# Patient Record
Sex: Male | Born: 1940 | Race: White | Hispanic: No | Marital: Married | Smoking: Current every day smoker
Health system: Southern US, Community
[De-identification: ages and names within clinical notes are randomized; demographics above are authoritative.]

## PROBLEM LIST (undated history)

## (undated) DIAGNOSIS — I639 Cerebral infarction, unspecified: Secondary | ICD-10-CM

## (undated) DIAGNOSIS — R972 Elevated prostate specific antigen [PSA]: Secondary | ICD-10-CM

## (undated) DIAGNOSIS — E785 Hyperlipidemia, unspecified: Secondary | ICD-10-CM

## (undated) DIAGNOSIS — I714 Abdominal aortic aneurysm, without rupture, unspecified: Secondary | ICD-10-CM

## (undated) DIAGNOSIS — I10 Essential (primary) hypertension: Secondary | ICD-10-CM

## (undated) DIAGNOSIS — N289 Disorder of kidney and ureter, unspecified: Secondary | ICD-10-CM

## (undated) HISTORY — DX: Hyperlipidemia, unspecified: E78.5

## (undated) HISTORY — PX: VARICOSE VEIN SURGERY: SHX832

## (undated) HISTORY — PX: HEMORRHOID SURGERY: SHX153

---

## 2008-05-22 HISTORY — PX: ENDARTERECTOMY: SHX5162

## 2008-05-27 ENCOUNTER — Ambulatory Visit: Payer: Self-pay | Admitting: Vascular Surgery

## 2008-06-10 ENCOUNTER — Ambulatory Visit: Payer: Self-pay | Admitting: Vascular Surgery

## 2008-06-10 ENCOUNTER — Ambulatory Visit: Payer: Self-pay | Admitting: Cardiology

## 2008-06-14 ENCOUNTER — Inpatient Hospital Stay: Payer: Self-pay | Admitting: Vascular Surgery

## 2011-06-07 ENCOUNTER — Emergency Department: Payer: Self-pay | Admitting: *Deleted

## 2011-06-07 LAB — COMPREHENSIVE METABOLIC PANEL
Albumin: 3.5 g/dL (ref 3.4–5.0)
Alkaline Phosphatase: 119 U/L (ref 50–136)
Anion Gap: 4 — ABNORMAL LOW (ref 7–16)
Bilirubin,Total: 0.4 mg/dL (ref 0.2–1.0)
Calcium, Total: 9 mg/dL (ref 8.5–10.1)
Co2: 30 mmol/L (ref 21–32)
Creatinine: 1.61 mg/dL — ABNORMAL HIGH (ref 0.60–1.30)
EGFR (African American): 49 — ABNORMAL LOW
EGFR (Non-African Amer.): 42 — ABNORMAL LOW
Glucose: 100 mg/dL — ABNORMAL HIGH (ref 65–99)
Osmolality: 278 (ref 275–301)
Potassium: 4.2 mmol/L (ref 3.5–5.1)
SGOT(AST): 21 U/L (ref 15–37)
SGPT (ALT): 14 U/L

## 2011-06-07 LAB — CBC
HGB: 14.3 g/dL (ref 13.0–18.0)
MCH: 30.9 pg (ref 26.0–34.0)
WBC: 9.8 10*3/uL (ref 3.8–10.6)

## 2012-01-01 ENCOUNTER — Ambulatory Visit: Payer: Self-pay | Admitting: Family Medicine

## 2012-10-02 ENCOUNTER — Encounter (HOSPITAL_COMMUNITY): Payer: Self-pay | Admitting: *Deleted

## 2012-10-02 ENCOUNTER — Emergency Department (HOSPITAL_COMMUNITY): Payer: Managed Care, Other (non HMO)

## 2012-10-02 ENCOUNTER — Other Ambulatory Visit: Payer: Self-pay

## 2012-10-02 ENCOUNTER — Emergency Department (HOSPITAL_COMMUNITY)
Admission: EM | Admit: 2012-10-02 | Discharge: 2012-10-03 | Disposition: A | Discharge status: Home / Self Care | Payer: Managed Care, Other (non HMO) | Attending: Emergency Medicine | Admitting: Emergency Medicine

## 2012-10-02 DIAGNOSIS — I714 Abdominal aortic aneurysm, without rupture, unspecified: Secondary | ICD-10-CM | POA: Insufficient documentation

## 2012-10-02 DIAGNOSIS — Y92410 Unspecified street and highway as the place of occurrence of the external cause: Secondary | ICD-10-CM | POA: Insufficient documentation

## 2012-10-02 DIAGNOSIS — Y939 Activity, unspecified: Secondary | ICD-10-CM | POA: Insufficient documentation

## 2012-10-02 DIAGNOSIS — S8002XA Contusion of left knee, initial encounter: Secondary | ICD-10-CM

## 2012-10-02 DIAGNOSIS — S8000XA Contusion of unspecified knee, initial encounter: Secondary | ICD-10-CM | POA: Insufficient documentation

## 2012-10-02 DIAGNOSIS — S40019A Contusion of unspecified shoulder, initial encounter: Secondary | ICD-10-CM | POA: Insufficient documentation

## 2012-10-02 DIAGNOSIS — S40012A Contusion of left shoulder, initial encounter: Secondary | ICD-10-CM

## 2012-10-02 DIAGNOSIS — Z23 Encounter for immunization: Secondary | ICD-10-CM | POA: Insufficient documentation

## 2012-10-02 HISTORY — DX: Essential (primary) hypertension: I10

## 2012-10-02 HISTORY — DX: Cerebral infarction, unspecified: I63.9

## 2012-10-02 LAB — CBC
MCH: 32.6 pg (ref 26.0–34.0)
MCHC: 35.7 g/dL (ref 30.0–36.0)
MCV: 91.5 fL (ref 78.0–100.0)
Platelets: 170 10*3/uL (ref 150–400)
RBC: 4.84 MIL/uL (ref 4.22–5.81)
RDW: 13.1 % (ref 11.5–15.5)

## 2012-10-02 LAB — COMPREHENSIVE METABOLIC PANEL
AST: 28 U/L (ref 0–37)
CO2: 26 mEq/L (ref 19–32)
Calcium: 9.6 mg/dL (ref 8.4–10.5)
Creatinine, Ser: 2.09 mg/dL — ABNORMAL HIGH (ref 0.50–1.35)
GFR calc non Af Amer: 30 mL/min — ABNORMAL LOW (ref >90)
Sodium: 134 mEq/L — ABNORMAL LOW (ref 135–145)
Total Protein: 7.4 g/dL (ref 6.0–8.3)

## 2012-10-02 LAB — POCT I-STAT, CHEM 8
Chloride: 99 mEq/L (ref 96–112)
Creatinine, Ser: 2.4 mg/dL — ABNORMAL HIGH (ref 0.50–1.35)
HCT: 47 % (ref 39.0–52.0)
Hemoglobin: 16 g/dL (ref 13.0–17.0)
Potassium: 5.1 mEq/L (ref 3.5–5.1)
Sodium: 137 mEq/L (ref 135–145)

## 2012-10-02 MED ORDER — TETANUS-DIPHTH-ACELL PERTUSSIS 5-2.5-18.5 LF-MCG/0.5 IM SUSP
0.5000 mL | Freq: Once | INTRAMUSCULAR | Status: AC
  Administered 2012-10-02: 0.5 mL via INTRAMUSCULAR
  Filled 2012-10-02: qty 0.5

## 2012-10-02 MED ORDER — MORPHINE SULFATE 4 MG/ML IJ SOLN
4.0000 mg | Freq: Once | INTRAMUSCULAR | Status: AC
  Administered 2012-10-02: 4 mg via INTRAVENOUS
  Filled 2012-10-02: qty 1

## 2012-10-02 NOTE — ED Notes (Signed)
Attempted to ambulate pt.  When pt sat up he became very dizzy and pt was returned to supine position.  MD made aware

## 2012-10-02 NOTE — Progress Notes (Signed)
Chaplain responded to page from ED. Patient was not available and there was no family present.
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
## 2012-11-14 ENCOUNTER — Ambulatory Visit: Payer: Self-pay | Admitting: Vascular Surgery

## 2013-02-11 ENCOUNTER — Emergency Department: Payer: Self-pay | Admitting: Emergency Medicine

## 2013-02-11 LAB — COMPREHENSIVE METABOLIC PANEL
AST: 21 U/L (ref 15–37)
Albumin: 3.2 g/dL — ABNORMAL LOW (ref 3.4–5.0)
Alkaline Phosphatase: 100 U/L
Anion Gap: 3 — ABNORMAL LOW (ref 7–16)
BILIRUBIN TOTAL: 0.3 mg/dL (ref 0.2–1.0)
BUN: 51 mg/dL — ABNORMAL HIGH (ref 7–18)
CALCIUM: 9.3 mg/dL (ref 8.5–10.1)
Chloride: 103 mmol/L (ref 98–107)
Co2: 27 mmol/L (ref 21–32)
Creatinine: 2.31 mg/dL — ABNORMAL HIGH (ref 0.60–1.30)
EGFR (African American): 31 — ABNORMAL LOW
EGFR (Non-African Amer.): 27 — ABNORMAL LOW
GLUCOSE: 110 mg/dL — AB (ref 65–99)
Osmolality: 281 (ref 275–301)
Potassium: 4.5 mmol/L (ref 3.5–5.1)
SGPT (ALT): 20 U/L (ref 12–78)
SODIUM: 133 mmol/L — AB (ref 136–145)
Total Protein: 6.8 g/dL (ref 6.4–8.2)

## 2013-02-11 LAB — CBC
HCT: 43.8 % (ref 40.0–52.0)
HGB: 14.7 g/dL (ref 13.0–18.0)
MCH: 31.8 pg (ref 26.0–34.0)
MCHC: 33.6 g/dL (ref 32.0–36.0)
MCV: 95 fL (ref 80–100)
PLATELETS: 176 10*3/uL (ref 150–440)
RBC: 4.63 10*6/uL (ref 4.40–5.90)
RDW: 13.7 % (ref 11.5–14.5)
WBC: 12 10*3/uL — AB (ref 3.8–10.6)

## 2013-02-15 ENCOUNTER — Emergency Department: Payer: Self-pay | Admitting: Emergency Medicine

## 2013-02-15 LAB — COMPREHENSIVE METABOLIC PANEL
ANION GAP: 2 — AB (ref 7–16)
Albumin: 2.7 g/dL — ABNORMAL LOW (ref 3.4–5.0)
Alkaline Phosphatase: 132 U/L — ABNORMAL HIGH
BILIRUBIN TOTAL: 1.1 mg/dL — AB (ref 0.2–1.0)
BUN: 36 mg/dL — AB (ref 7–18)
CALCIUM: 9.8 mg/dL (ref 8.5–10.1)
Chloride: 97 mmol/L — ABNORMAL LOW (ref 98–107)
Co2: 30 mmol/L (ref 21–32)
Creatinine: 2.34 mg/dL — ABNORMAL HIGH (ref 0.60–1.30)
EGFR (African American): 31 — ABNORMAL LOW
EGFR (Non-African Amer.): 27 — ABNORMAL LOW
Glucose: 119 mg/dL — ABNORMAL HIGH (ref 65–99)
OSMOLALITY: 268 (ref 275–301)
Potassium: 4.4 mmol/L (ref 3.5–5.1)
SGOT(AST): 17 U/L (ref 15–37)
SGPT (ALT): 23 U/L (ref 12–78)
Sodium: 129 mmol/L — ABNORMAL LOW (ref 136–145)
Total Protein: 7.5 g/dL (ref 6.4–8.2)

## 2013-02-15 LAB — CBC
HCT: 42.5 % (ref 40.0–52.0)
HGB: 14.3 g/dL (ref 13.0–18.0)
MCH: 31.9 pg (ref 26.0–34.0)
MCHC: 33.6 g/dL (ref 32.0–36.0)
MCV: 95 fL (ref 80–100)
PLATELETS: 192 10*3/uL (ref 150–440)
RBC: 4.47 10*6/uL (ref 4.40–5.90)
RDW: 13.7 % (ref 11.5–14.5)
WBC: 13.4 10*3/uL — ABNORMAL HIGH (ref 3.8–10.6)

## 2013-02-15 LAB — URINALYSIS, COMPLETE
Bacteria: NONE SEEN
Bilirubin,UR: NEGATIVE
Glucose,UR: 50 mg/dL (ref 0–75)
Ketone: NEGATIVE
Leukocyte Esterase: NEGATIVE
Nitrite: NEGATIVE
Ph: 5 (ref 4.5–8.0)
RBC,UR: 1 /HPF (ref 0–5)
Specific Gravity: 1.019 (ref 1.003–1.030)

## 2013-02-15 LAB — CK: CK, TOTAL: 14 U/L — AB (ref 35–232)

## 2013-02-27 ENCOUNTER — Other Ambulatory Visit: Payer: Self-pay | Admitting: Nephrology

## 2013-02-27 LAB — POTASSIUM: Potassium: 5.1 mmol/L (ref 3.5–5.1)

## 2014-07-14 ENCOUNTER — Other Ambulatory Visit: Payer: Self-pay | Admitting: *Deleted

## 2014-07-14 DIAGNOSIS — I10 Essential (primary) hypertension: Secondary | ICD-10-CM

## 2014-07-15 ENCOUNTER — Other Ambulatory Visit: Payer: Self-pay

## 2014-07-15 MED ORDER — AMLODIPINE BESYLATE 10 MG PO TABS: 10.0000 mg | ORAL_TABLET | Freq: Every day | ORAL | Status: DC

## 2014-07-15 NOTE — Telephone Encounter (Signed)
Pharmacy faxed refill request.  Thanks!  

## 2014-08-02 ENCOUNTER — Telehealth: Payer: Self-pay | Admitting: Family Medicine

## 2014-08-02 NOTE — Telephone Encounter (Signed)
Requesting refill on pravastatin . Please send to [facility]

## 2014-08-13 NOTE — Telephone Encounter (Signed)
Appointment has been made for this Monday.

## 2014-08-16 ENCOUNTER — Ambulatory Visit: Payer: Self-pay | Admitting: Family Medicine

## 2014-08-16 ENCOUNTER — Ambulatory Visit (INDEPENDENT_AMBULATORY_CARE_PROVIDER_SITE_OTHER): Payer: Medicare Other | Admitting: Family Medicine

## 2014-08-16 ENCOUNTER — Encounter: Payer: Self-pay | Admitting: Family Medicine

## 2014-08-16 VITALS — BP 112/71 | HR 75 | Temp 98.3°F | Resp 18 | Ht 67.0 in | Wt 175.8 lb

## 2014-08-16 DIAGNOSIS — I10 Essential (primary) hypertension: Secondary | ICD-10-CM

## 2014-08-16 DIAGNOSIS — E785 Hyperlipidemia, unspecified: Secondary | ICD-10-CM | POA: Diagnosis not present

## 2014-08-16 DIAGNOSIS — L989 Disorder of the skin and subcutaneous tissue, unspecified: Secondary | ICD-10-CM | POA: Insufficient documentation

## 2014-08-16 DIAGNOSIS — R0989 Other specified symptoms and signs involving the circulatory and respiratory systems: Secondary | ICD-10-CM | POA: Insufficient documentation

## 2014-08-16 DIAGNOSIS — I714 Abdominal aortic aneurysm, without rupture, unspecified: Secondary | ICD-10-CM | POA: Insufficient documentation

## 2014-08-16 MED ORDER — AMLODIPINE BESYLATE 10 MG PO TABS: 10.0000 mg | ORAL_TABLET | Freq: Every day | ORAL | Status: DC

## 2014-08-16 MED ORDER — METOPROLOL TARTRATE 100 MG PO TABS: 100.0000 mg | ORAL_TABLET | Freq: Every morning | ORAL | Status: DC

## 2014-08-16 NOTE — Progress Notes (Signed)
Name: John Knight   MRN: [id number]    DOB: Mar 25, 1940   Date:08/16/2014       Progress Note  Subjective  Chief Complaint  Chief Complaint  Patient presents with  . Medication Refill  . Hyperlipidemia  . Hypertension    Hyperlipidemia This is a chronic problem. The problem is uncontrolled. Recent lipid tests were reviewed and are high. He has no history of diabetes or liver disease. Pertinent negatives include no chest pain. Current antihyperlipidemic treatment includes statins.  Hypertension This is a chronic problem. The problem is controlled. Pertinent negatives include no blurred vision, chest pain, headaches or palpitations. Past treatments include beta blockers and calcium channel blockers. The current treatment provides significant improvement. Hypertensive end-organ damage includes CVA. There is no history of angina, kidney disease or CAD/MI.      Past Medical History  Diagnosis Date  . Hypertension   . Stroke   . Hyperlipidemia     Past Surgical History  Procedure Laterality Date  . Endarterectomy Right 05/2008  . Varicose vein surgery    . Hemorrhoid surgery      Family History  Problem Relation Age of Onset  . Heart attack Mother   . Cancer Father   . Hypertension Father   . Diabetes Brother     History   Social History  . Marital Status: Married    Spouse Name: N/A  . Number of Children: N/A  . Years of Education: N/A   Occupational History  . Not on file.   Social History Main Topics  . Smoking status: Current Every Day Smoker -- 1.00 packs/day    Types: Cigarettes  . Smokeless tobacco: Not on file  . Alcohol Use: No  . Drug Use: No  . Sexual Activity: Not on file   Other Topics Concern  . Not on file   Social History Narrative     Current outpatient prescriptions:  .  amLODipine (NORVASC) 10 MG tablet, Take 1 tablet (10 mg total) by mouth daily., Disp: 30 tablet, Rfl: 0 .  aspirin 81 MG chewable tablet, Chew 81 mg by mouth daily.,  Disp: , Rfl:  .  diphenhydrAMINE (BENADRYL) 25 MG tablet, Take 25 mg by mouth at bedtime as needed for allergies., Disp: , Rfl:  .  lisinopril (PRINIVIL,ZESTRIL) 10 MG tablet, Take 10 mg by mouth daily., Disp: , Rfl:  .  metoprolol (LOPRESSOR) 100 MG tablet, Take 100 mg by mouth every morning., Disp: , Rfl: 2 .  metoprolol (LOPRESSOR) 50 MG tablet, Take 50 mg by mouth 2 (two) times daily., Disp: , Rfl:  .  pravastatin (PRAVACHOL) 40 MG tablet, Take by mouth., Disp: , Rfl:  .  rosuvastatin (CRESTOR) 5 MG tablet, Take 5 mg by mouth., Disp: , Rfl:   No Known Allergies   Review of Systems  Eyes: Negative for blurred vision.  Cardiovascular: Negative for chest pain and palpitations.  Neurological: Negative for headaches.      Objective  Filed Vitals:   08/16/14 1338  BP: 112/71  Pulse: 75  Temp: 98.3 F (36.8 C)  TempSrc: Oral  Resp: 18  Height:  (1.702 m)  Weight: 175 lb 12.8 oz (79.742 kg)  SpO2: 92%    Physical Exam  Constitutional: He is oriented to person, place, and time and well-developed, well-nourished, and in no distress.  Cardiovascular: Normal rate and regular rhythm.   Pulmonary/Chest: Effort normal and breath sounds normal.  Abdominal: Soft. Bowel sounds are normal.  Neurological:  He is alert and oriented to person, place, and time.  Skin: Lesion (pustular non-healing lesion over the left outer ear, present for over 1 year, started flaking off in the last week.) noted.  Nursing note and vitals reviewed.    Assessment & Plan 1. Hyperlipidemia Schendt is on Crestor 5 mg at bedtime. Recheck fasting lipid panel and liver enzymes and follow-up. - Comprehensive Metabolic Panel (CMET) - Lipid panel  2. Benign hypertension Blood pressure stable and controlled on present therapy. - amLODipine (NORVASC) 10 MG tablet; Take 1 tablet (10 mg total) by mouth daily.  Dispense: 90 tablet; Refill: 0 - metoprolol (LOPRESSOR) 100 MG tablet; Take 1 tablet (100 mg total)  by mouth every morning.  Dispense: 90 tablet; Refill: 0  3. Non-healing skin lesion Referral to dermatology for evaluation of nonhealing skin lesion. - Ambulatory referral to Dermatology    [facility] 08/16/2014 1:53 PM

## 2014-11-10 ENCOUNTER — Other Ambulatory Visit: Payer: Self-pay | Admitting: Family Medicine

## 2015-01-10 ENCOUNTER — Emergency Department
Admission: EM | Admit: 2015-01-10 | Discharge: 2015-01-11 | Disposition: A | Discharge status: Home / Self Care | Payer: Medicare Other | Attending: Emergency Medicine | Admitting: Emergency Medicine

## 2015-01-10 ENCOUNTER — Encounter: Payer: Self-pay | Admitting: Urgent Care

## 2015-01-10 DIAGNOSIS — I10 Essential (primary) hypertension: Secondary | ICD-10-CM | POA: Diagnosis not present

## 2015-01-10 DIAGNOSIS — Z79899 Other long term (current) drug therapy: Secondary | ICD-10-CM | POA: Diagnosis not present

## 2015-01-10 DIAGNOSIS — I714 Abdominal aortic aneurysm, without rupture, unspecified: Secondary | ICD-10-CM

## 2015-01-10 DIAGNOSIS — Z7982 Long term (current) use of aspirin: Secondary | ICD-10-CM | POA: Diagnosis not present

## 2015-01-10 DIAGNOSIS — F17210 Nicotine dependence, cigarettes, uncomplicated: Secondary | ICD-10-CM | POA: Insufficient documentation

## 2015-01-10 DIAGNOSIS — R1033 Periumbilical pain: Secondary | ICD-10-CM | POA: Diagnosis present

## 2015-01-10 HISTORY — DX: Elevated prostate specific antigen (PSA): R97.20

## 2015-01-10 HISTORY — DX: Abdominal aortic aneurysm, without rupture, unspecified: I71.40

## 2015-01-10 HISTORY — DX: Abdominal aortic aneurysm, without rupture: I71.4

## 2015-01-10 HISTORY — DX: Disorder of kidney and ureter, unspecified: N28.9

## 2015-01-10 LAB — COMPREHENSIVE METABOLIC PANEL
ALK PHOS: 126 U/L (ref 38–126)
ALT: 11 U/L — AB (ref 17–63)
AST: 16 U/L (ref 15–41)
Albumin: 4.1 g/dL (ref 3.5–5.0)
Anion gap: 9 (ref 5–15)
BILIRUBIN TOTAL: 0.7 mg/dL (ref 0.3–1.2)
BUN: 30 mg/dL — ABNORMAL HIGH (ref 6–20)
CALCIUM: 9.8 mg/dL (ref 8.9–10.3)
CO2: 29 mmol/L (ref 22–32)
CREATININE: 1.58 mg/dL — AB (ref 0.61–1.24)
Chloride: 100 mmol/L — ABNORMAL LOW (ref 101–111)
GFR calc non Af Amer: 41 mL/min — ABNORMAL LOW (ref >60)
GFR, EST AFRICAN AMERICAN: 48 mL/min — AB (ref >60)
GLUCOSE: 140 mg/dL — AB (ref 65–99)
Potassium: 3.9 mmol/L (ref 3.5–5.1)
SODIUM: 138 mmol/L (ref 135–145)
Total Protein: 7.9 g/dL (ref 6.5–8.1)

## 2015-01-10 LAB — CBC
HEMATOCRIT: 47.3 % (ref 40.0–52.0)
HEMOGLOBIN: 15.4 g/dL (ref 13.0–18.0)
MCH: 29.4 pg (ref 26.0–34.0)
MCHC: 32.6 g/dL (ref 32.0–36.0)
MCV: 90.3 fL (ref 80.0–100.0)
Platelets: 175 10*3/uL (ref 150–440)
RBC: 5.24 MIL/uL (ref 4.40–5.90)
RDW: 13.9 % (ref 11.5–14.5)
WBC: 11.6 10*3/uL — ABNORMAL HIGH (ref 3.8–10.6)

## 2015-01-10 LAB — PROTIME-INR
INR: 0.97
Prothrombin Time: 13.1 seconds (ref 11.4–15.0)

## 2015-01-10 MED ORDER — LABETALOL HCL 5 MG/ML IV SOLN
10.0000 mg | Freq: Once | INTRAVENOUS | Status: AC
  Administered 2015-01-10: 10 mg via INTRAVENOUS

## 2015-01-10 MED ORDER — LABETALOL HCL 5 MG/ML IV SOLN
INTRAVENOUS | Status: AC
  Administered 2015-01-10: 10 mg via INTRAVENOUS
  Filled 2015-01-10: qty 4

## 2015-01-10 MED ORDER — CLONIDINE HCL 0.1 MG PO TABS
0.1000 mg | ORAL_TABLET | Freq: Once | ORAL | Status: AC
  Administered 2015-01-11: 0.1 mg via ORAL
  Filled 2015-01-10: qty 1

## 2015-01-10 NOTE — ED Provider Notes (Signed)
[facility] Emergency Department Provider Note  ____________________________________________  Time seen: 11:00 PM  I have reviewed the triage vital signs and the nursing notes.   HISTORY  Chief Complaint Abdominal Pain      HPI John Knight is a 74 y.o. male presents with acute onset of periumbilical abdominal pain that was initially 8 out of 10. Patient states the pain started at 3:00 PM. Patient denies any aggravating or alleviating factors. Patient denies any nausea vomiting or diarrhea accompanying the pain. In addition patient denies any fever. Of note patient has an inoperable intra-abdominal aortic aneurysm that he was informed by Dr. Pattricia BossFarber 12/30/2014 that it had increased in size. Of note patient's blood pressure on presentation emergency department was 190/86 at bedside at present is 213/90.     Past Medical History  Diagnosis Date  . Hypertension   . Stroke (HCC)   . Hyperlipidemia   . AAA (abdominal aortic aneurysm) (HCC)   . Renal disorder   . Elevated PSA     Patient Active Problem List   Diagnosis Date Noted  . AAA (abdominal aortic aneurysm) without rupture (HCC) 08/16/2014  . Benign hypertension 08/16/2014  . Hyperlipidemia 08/16/2014  . Non-healing skin lesion 08/16/2014    Past Surgical History  Procedure Laterality Date  . Endarterectomy Right 05/2008  . Varicose vein surgery    . Hemorrhoid surgery      Current Outpatient Rx  Name  Route  Sig  Dispense  Refill  . amLODipine (NORVASC) 10 MG tablet      TAKE 1 TABLET (10 MG TOTAL) BY MOUTH DAILY.   90 tablet   0   . aspirin 81 MG chewable tablet   Oral   Chew 81 mg by mouth daily.         . diphenhydrAMINE (BENADRYL) 25 MG tablet   Oral   Take 25 mg by mouth at bedtime as needed for allergies.         . metoprolol (LOPRESSOR) 100 MG tablet      TAKE 1 TABLET (100 MG TOTAL) BY MOUTH EVERY MORNING.   90 tablet   0   . rosuvastatin (CRESTOR) 5 MG  tablet   Oral   Take 5 mg by mouth.           Allergies Review of patient's allergies indicates no known allergies.  Family History  Problem Relation Age of Onset  . Heart attack Mother   . Cancer Father   . Hypertension Father   . Diabetes Brother     Social History Social History  Substance Use Topics  . Smoking status: Current Every Day Smoker -- 1.00 packs/day    Types: Cigarettes  . Smokeless tobacco: None  . Alcohol Use: No    Review of Systems  Constitutional: Negative for fever. Eyes: Negative for visual changes. ENT: Negative for sore throat. Cardiovascular: Negative for chest pain. Respiratory: Negative for shortness of breath. Gastrointestinal: Positive for abdominal pain Genitourinary: Negative for dysuria. Musculoskeletal: Negative for back pain. Skin: Negative for rash. Neurological: Negative for headaches, focal weakness or numbness.   10-point ROS otherwise negative.  ____________________________________________   PHYSICAL EXAM:  VITAL SIGNS: ED Triage Vitals  Enc Vitals Group     BP 01/10/15 2252 190/86 mmHg     Pulse Rate 01/10/15 2252 100     Resp 01/10/15 2252 16     Temp 01/10/15 2252 98.4 F (36.9 C)     Temp Source 01/10/15 2252  Oral     SpO2 01/10/15 2252 96 %     Weight 01/10/15 2252 175 lb (79.379 kg)     Height 01/10/15 2252  (1.727 m)     Head Cir --      Peak Flow --      Pain Score 01/10/15 2252 8     Pain Loc --      Pain Edu? --      Excl. in GC? --      Constitutional: Alert and oriented. Well appearing and in no distress. Eyes: Conjunctivae are normal. PERRL. Normal extraocular movements. ENT   Head: Normocephalic and atraumatic.   Nose: No congestion/rhinnorhea.   Mouth/Throat: Mucous membranes are moist.   Neck: No stridor. Hematological/Lymphatic/Immunilogical: No cervical lymphadenopathy. Cardiovascular: Normal rate, regular rhythm. Normal and symmetric distal pulses are present in all  extremities. No murmurs, rubs, or gallops. Respiratory: Normal respiratory effort without tachypnea nor retractions. Breath sounds are clear and equal bilaterally. No wheezes/rales/rhonchi. Gastrointestinal: Soft and nontender. No distention. There is no CVA tenderness. Genitourinary: deferred Musculoskeletal: Nontender with normal range of motion in all extremities. No joint effusions.  No lower extremity tenderness nor edema. Neurologic:  Normal speech and language. No gross focal neurologic deficits are appreciated. Speech is normal.  Skin:  Skin is warm, dry and intact. No rash noted. Psychiatric: Mood and affect are normal. Speech and behavior are normal. Patient exhibits appropriate insight and judgment.  ____________________________________________    LABS (pertinent positives/negatives)  Labs Reviewed  CBC - Abnormal; Notable for the following:    WBC 11.6 (*)    All other components within normal limits  COMPREHENSIVE METABOLIC PANEL - Abnormal; Notable for the following:    Chloride 100 (*)    Glucose, Bld 140 (*)    BUN 30 (*)    Creatinine, Ser 1.58 (*)    ALT 11 (*)    GFR calc non Af Amer 41 (*)    GFR calc Af Amer 48 (*)    All other components within normal limits  PROTIME-INR     ____________________________________________   EKG ED ECG REPORT I, BROWN, [place] N, the attending physician, personally viewed and interpreted this ECG.   Date: 01/13/2015  EKG Time: 11:06 PM  Rate: 85  Rhythm: Normal sinus rhythm  Axis: None  Intervals: Normal  ST&T Change: none   RADIOLOGY CT CTA Abd/Pel w/cm &/or w/o cm (Final result) Result time: 01/11/15 01:28:51   Final result by Rad Results In Interface (01/11/15 01:28:51)   Narrative:   CLINICAL DATA: Mid abdominal pain. Known abdominal aortic aneurysm, reportedly inoperable.  EXAM: CTA ABDOMEN AND PELVIS wITHOUT AND WITH CONTRAST  TECHNIQUE: Multidetector CT imaging of the abdomen and pelvis was  performed using the standard protocol during bolus administration of intravenous contrast. Multiplanar reconstructed images and MIPs were obtained and reviewed to evaluate the vascular anatomy.  CONTRAST: 80mL OMNIPAQUE IOHEXOL 350 MG/ML SOLN  COMPARISON: 02/15/2013  FINDINGS: Lower chest and abdominal wall: Fatty enlargement of the bilateral inguinal canal.  Hepatobiliary: No focal liver abnormality.No evidence of biliary obstruction or stone.  Pancreas: Unremarkable.  Spleen: Unremarkable.  Adrenals/Urinary Tract: Negative adrenals. No hydronephrosis or stone. 1 cm mass exophytic from the lower pole left kidney a soft tissue density but is intrinsically hyperdense based on comparison noncontrast CT, likely a hemorrhagic cyst. Unremarkable bladder.  Reproductive:No pathologic findings.  Stomach/Bowel: No obstruction. No appendicitis.  Vascular/Lymphatic: There is extensive atheromatous plaque and adherent thrombus on the visualized lower  thoracic aorta. A fusiform abdominal aortic aneurysm from the renal level inferiorly has grown since prior, currently 70 x 65 mm (previously 58 x 59 mm). There is no draping of the aorta or retroperitoneal hematoma. No discontinuous atherosclerotic calcification compared to prior. High-density partly calcified material within ventral thrombus is unchanged. No evidence of superimposed dissection.  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 There is diffuse atherosclerotic luminal irregularity of the bilateral iliac arteries with moderate stenosis at the right common iliac bifurcation.  Diffuse visceral branch narrowing, high-grade at the celiac axis, proximal SMA, and proximal right renal artery.  Peritoneal: No ascites or pneumoperitoneum.  Musculoskeletal: No acute abnormalities.  Review of the MIP images confirms the above findings.  IMPRESSION: 1. 7 cm diameter abdominal aortic aneurysm which has grown 1 cm since  02/15/2013. No current sign of rupture. 2. Advanced proximal celiac and SMA stenoses. 3. No acute visceral finding.   Electronically Signed By: Marnee Spring M.D. On: 01/11/2015 01:28             INITIAL IMPRESSION / ASSESSMENT AND PLAN / ED COURSE  Pertinent labs & imaging results that were available during my care of the patient were reviewed by me and considered in my medical decision making (see chart for details).  Patient stated that he did not take his antihypertensive medications today as he forgot. Patient received labetalol 2 doses of 20 mg IV was 0.3 mg of by mouth clonidine with blood pressure improvement. Review of the patient's CAT scan from 12/8 reveals that his aneurysm is currently the same size at 7 cm. Patient had an appointment with his vascular surgeon at that time who reviewed the images. I spoke with the patient at length regarding necessity follow up with his vascular surgeon  ____________________________________________   FINAL CLINICAL IMPRESSION(S) / ED DIAGNOSES  Final diagnoses:  AAA (abdominal aortic aneurysm) without rupture (HCC)      Darci Current, MD 01/13/15 [phone]

## 2015-01-10 NOTE — ED Notes (Signed)
Patient presents with c/o mid-abdominal pain that began around 1500. Denies N/V. Last BM was tonight; stool was loose. Of note, patient has a known AAA; ballooned above and below kidney - inoperable per patient report.

## 2015-01-10 NOTE — ED Notes (Addendum)
Dr. Manson PasseyBrown at bedside.   Pt presents to ED with lower abdominal pain. Pt has a known AAA that pt states is inoperable. Pt states that AAA has grown in the last year. Pain started today after eating a BBQ sandwich. Pt denies diarrhea or fever, denies nausea.

## 2015-01-11 ENCOUNTER — Emergency Department: Payer: Medicare Other

## 2015-01-11 MED ORDER — OXYCODONE-ACETAMINOPHEN 5-325 MG PO TABS: 1.0000 | ORAL_TABLET | ORAL | Status: AC | PRN

## 2015-01-11 MED ORDER — IOHEXOL 350 MG/ML SOLN
80.0000 mL | Freq: Once | INTRAVENOUS | Status: AC | PRN
  Administered 2015-01-11: 80 mL via INTRAVENOUS

## 2015-01-11 MED ORDER — CLONIDINE HCL 0.1 MG PO TABS
0.2000 mg | ORAL_TABLET | Freq: Once | ORAL | Status: AC
  Administered 2015-01-11: 0.2 mg via ORAL
  Filled 2015-01-11: qty 2

## 2015-01-11 MED ORDER — LABETALOL HCL 5 MG/ML IV SOLN
20.0000 mg | Freq: Once | INTRAVENOUS | Status: AC
  Administered 2015-01-11: 20 mg via INTRAVENOUS
  Filled 2015-01-11: qty 4

## 2015-01-11 NOTE — Discharge Instructions (Signed)
Abdominal Aortic Aneurysm An aneurysm is a weakened or damaged part of an artery wall that bulges from the normal force of blood pumping through the body. An abdominal aortic aneurysm is an aneurysm that occurs in the lower part of the aorta, the main artery of the body.  The major concern with an abdominal aortic aneurysm is that it can enlarge and burst (rupture) or blood can flow between the layers of the wall of the aorta through a tear (aorticdissection). Both of these conditions can cause bleeding inside the body and can be life threatening unless diagnosed and treated promptly. CAUSES  The exact cause of an abdominal aortic aneurysm is unknown. Some contributing factors are:   A hardening of the arteries caused by the buildup of fat and other substances in the lining of a blood vessel (arteriosclerosis).  Inflammation of the walls of an artery (arteritis).   Connective tissue diseases, such as Marfan syndrome.   Abdominal trauma.   An infection, such as syphilis or staphylococcus, in the wall of the aorta (infectious aortitis) caused by bacteria. RISK FACTORS  Risk factors that contribute to an abdominal aortic aneurysm may include:  Age older than 60 years.   High blood pressure (hypertension).  Male gender.  Ethnicity (white race).  Obesity.  Family history of aneurysm (first degree relatives only).  Tobacco use. PREVENTION  The following healthy lifestyle habits may help decrease your risk of abdominal aortic aneurysm:  Quitting smoking. Smoking can raise your blood pressure and cause arteriosclerosis.  Limiting or avoiding alcohol.  Keeping your blood pressure, blood sugar level, and cholesterol levels within normal limits.  Decreasing your salt intake. In somepeople, too much salt can raise blood pressure and increase your risk of abdominal aortic aneurysm.  Eating a diet low in saturated fats and cholesterol.  Increasing your fiber intake by including  whole grains, vegetables, and fruits in your diet. Eating these foods may help lower blood pressure.  Maintaining a healthy weight.  Staying physically active and exercising regularly. SYMPTOMS  The symptoms of abdominal aortic aneurysm may vary depending on the size and rate of growth of the aneurysm.Most grow slowly and do not have any symptoms. When symptoms do occur, they may include:  Pain (abdomen, side, lower back, or groin). The pain may vary in intensity. A sudden onset of severe pain may indicate that the aneurysm has ruptured.  Feeling full after eating only small amounts of food.  Nausea or vomiting or both.  Feeling a pulsating lump in the abdomen.  Feeling faint or passing out. DIAGNOSIS  Since most unruptured abdominal aortic aneurysms have no symptoms, they are often discovered during diagnostic exams for other conditions. An aneurysm may be found during the following procedures:  Ultrasonography (A one-time screening for abdominal aortic aneurysm by ultrasonography is also recommended for all men aged 65-75 years who have ever smoked).  X-ray exams.  A computed tomography (CT).  Magnetic resonance imaging (MRI).  Angiography or arteriography. TREATMENT  Treatment of an abdominal aortic aneurysm depends on the size of your aneurysm, your age, and risk factors for rupture. Medication to control blood pressure and pain may be used to manage aneurysms smaller than 6 cm. Regular monitoring for enlargement may be recommended by your caregiver if:  The aneurysm is 3-4 cm in size (an annual ultrasonography may be recommended).  The aneurysm is 4-4.5 cm in size (an ultrasonography every 6 months may be recommended).  The aneurysm is larger than 4.5 cm in   size (your caregiver may ask that you be examined by a vascular surgeon). If your aneurysm is larger than 6 cm, surgical repair may be recommended. There are two main methods for repair of an aneurysm:   Endovascular  repair (a minimally invasive surgery). This is done most often.  Open repair. This method is used if an endovascular repair is not possible.   This information is not intended to replace advice given to you by your health care provider. Make sure you discuss any questions you have with your health care provider.   Document Released: 10/18/2004 Document Revised: 05/05/2012 Document Reviewed: 02/08/2012 Elsevier Interactive Patient Education 2016 Elsevier Inc.  

## 2015-01-11 NOTE — ED Notes (Signed)

## 2015-01-11 NOTE — ED Notes (Signed)
Pt denies any headache, blurred vision, numbness, or tingling at this time.

## 2015-01-11 NOTE — ED Notes (Signed)
Pt returned from CT °

## 2015-01-11 NOTE — ED Notes (Signed)
Pt transported to CT via stretcher.  

## 2015-01-11 NOTE — ED Notes (Signed)
Pt states he did not take Metroprolol and Norvasc this morning.

## 2015-01-27 ENCOUNTER — Ambulatory Visit: Payer: Medicare Other | Admitting: Family Medicine

## 2015-01-28 ENCOUNTER — Encounter: Payer: Self-pay | Admitting: Family Medicine

## 2015-01-28 ENCOUNTER — Ambulatory Visit (INDEPENDENT_AMBULATORY_CARE_PROVIDER_SITE_OTHER): Payer: Medicare Other | Admitting: Family Medicine

## 2015-01-28 VITALS — BP 142/78 | HR 68 | Temp 98.4°F | Resp 17 | Ht 68.0 in | Wt 172.8 lb

## 2015-01-28 DIAGNOSIS — E785 Hyperlipidemia, unspecified: Secondary | ICD-10-CM | POA: Diagnosis not present

## 2015-01-28 DIAGNOSIS — N183 Chronic kidney disease, stage 3 unspecified: Secondary | ICD-10-CM

## 2015-01-28 DIAGNOSIS — I714 Abdominal aortic aneurysm, without rupture, unspecified: Secondary | ICD-10-CM

## 2015-01-28 DIAGNOSIS — I10 Essential (primary) hypertension: Secondary | ICD-10-CM

## 2015-01-28 MED ORDER — AMLODIPINE BESYLATE 10 MG PO TABS: 10.0000 mg | ORAL_TABLET | Freq: Every day | ORAL | Status: DC

## 2015-01-28 MED ORDER — METOPROLOL TARTRATE 100 MG PO TABS: 100.0000 mg | ORAL_TABLET | Freq: Every day | ORAL | Status: DC

## 2015-01-28 NOTE — Progress Notes (Signed)
Name: John Knight   MRN: [id number]    DOB: [birth year]   Date:01/28/2015       Progress Note  Subjective  Chief Complaint  Chief Complaint  Patient presents with  . Follow-up    ER / abdominal pain    Hypertension This is a chronic problem. The problem is unchanged. The problem is controlled. Pertinent negatives include no blurred vision, chest pain, headaches or palpitations. Past treatments include beta blockers and calcium channel blockers. There are no compliance problems.  Hypertensive end-organ damage includes kidney disease.  Hyperlipidemia This is a chronic problem. The problem is uncontrolled. Recent lipid tests were reviewed and are high. Pertinent negatives include no chest pain. He is currently on no antihyperlipidemic treatment (Stopped taking Pravastatin approximately 3-4 months ago.). Risk factors for coronary artery disease include dyslipidemia, male sex and hypertension.   Abdominal Aortic Aneurysm Pt. Has AAA, slightly increased in diameter from 7.1cm x 6.3 cm, increased from 7.1cm x 6.0cm. Per his vascular surgeon, he will continue to monitor the aneurysm with a repeat CT in 01-22-16. [facility] ER on Monday, December 26 th with abdominal pain, a CT Scan of Abdomen revealed 7 cm AAA and advanced proximal celiac and SMA stenoses.Pt. continues to follow up with Vascular surgery at [facility]. Past Medical History  Diagnosis Date  . Hypertension   . Stroke ([place])   . Hyperlipidemia   . AAA (abdominal aortic aneurysm) ([place])   . Renal disorder   . Elevated PSA     Past Surgical History  Procedure Laterality Date  . Endarterectomy Right 05/2008  . Varicose vein surgery    . Hemorrhoid surgery      Family History  Problem Relation Age of Onset  . Heart attack Mother   . Cancer Father   . Hypertension Father   . Diabetes Brother     Social History   Social History  . Marital Status: Married    Spouse Name: N/A  . Number of Children: N/A  . Years  of Education: N/A   Occupational History  . Not on file.   Social History Main Topics  . Smoking status: Current Every Day Smoker -- 1.00 packs/day    Types: Cigarettes  . Smokeless tobacco: Not on file  . Alcohol Use: No  . Drug Use: No  . Sexual Activity: Not on file   Other Topics Concern  . Not on file   Social History Narrative     Current outpatient prescriptions:  .  amLODipine (NORVASC) 10 MG tablet, TAKE 1 TABLET (10 MG TOTAL) BY MOUTH DAILY., Disp: 90 tablet, Rfl: 0 .  aspirin 81 MG chewable tablet, Chew 81 mg by mouth daily., Disp: , Rfl:  .  aspirin-sod bicarb-citric acid (ALKA-SELTZER) 325 MG TBEF tablet, Take 325 mg by mouth every 6 (six) hours as needed., Disp: , Rfl:  .  metoprolol (LOPRESSOR) 100 MG tablet, TAKE 1 TABLET (100 MG TOTAL) BY MOUTH EVERY MORNING., Disp: 90 tablet, Rfl: 0 .  oxyCODONE-acetaminophen (ROXICET) 5-325 MG tablet, Take 1 tablet by mouth every 4 (four) hours as needed for severe pain., Disp: 20 tablet, Rfl: 0  No Known Allergies   Review of Systems  Eyes: Negative for blurred vision and double vision.  Cardiovascular: Negative for chest pain and palpitations.  Gastrointestinal: Negative for nausea, vomiting and abdominal pain.  Neurological: Negative for headaches.     Objective  Filed Vitals:   01/28/15 0949  BP: 142/78  Pulse: 68  Temp: 98.4 F (36.9 C)  TempSrc: Oral  Resp: 17  Height: _0  (1.727 m)  Weight: 172 lb 12.8 oz (78.382 kg)  SpO2: 96%    Physical Exam  Constitutional: He is oriented to person, place, and time and well-developed, well-nourished, and in no distress.  HENT:  Head: Normocephalic and atraumatic.  Cardiovascular: Normal rate and regular rhythm.   Pulmonary/Chest: Effort normal and breath sounds normal. He has no wheezes.  Abdominal: Soft. Bowel sounds are normal. There is no tenderness.  Musculoskeletal: He exhibits no edema (1+ pitting edema RLE).  Neurological: He is alert and oriented to  person, place, and time.  Skin: Skin is warm and dry.  Nursing note and vitals reviewed.   Recent Results (from the past 2160 hour(s))  CBC     Status: Abnormal   Collection Time: 01/10/15 11:20 PM  Result Value Ref Range   WBC 11.6 (H) 3.8 - 10.6 K/uL   RBC 5.24 4.40 - 5.90 MIL/uL   Hemoglobin 15.4 13.0 - 18.0 g/dL   HCT 47.3 40.0 - 52.0 %   MCV 90.3 80.0 - 100.0 fL   MCH 29.4 26.0 - 34.0 pg   MCHC 32.6 32.0 - 36.0 g/dL   RDW 13.9 11.5 - 14.5 %   Platelets 175 150 - 440 K/uL  Comprehensive metabolic panel     Status: Abnormal   Collection Time: 01/10/15 11:20 PM  Result Value Ref Range   Sodium 138 135 - 145 mmol/L   Potassium 3.9 3.5 - 5.1 mmol/L   Chloride 100 (L) 101 - 111 mmol/L   CO2 29 22 - 32 mmol/L   Glucose, Bld 140 (H) 65 - 99 mg/dL   BUN 30 (H) 6 - 20 mg/dL   Creatinine, Ser 1.58 (H) 0.61 - 1.24 mg/dL   Calcium 9.8 8.9 - 10.3 mg/dL   Total Protein 7.9 6.5 - 8.1 g/dL   Albumin 4.1 3.5 - 5.0 g/dL   AST 16 15 - 41 U/L   ALT 11 (L) 17 - 63 U/L   Alkaline Phosphatase 126 38 - 126 U/L   Total Bilirubin 0.7 0.3 - 1.2 mg/dL   GFR calc non Af Amer 41 (L) >60 mL/min   GFR calc Af Amer 48 (L) >60 mL/min    Comment: (NOTE) The eGFR has been calculated using the CKD EPI equation. This calculation has not been validated in all clinical situations. eGFR's persistently <60 mL/min signify possible Chronic Kidney Disease.    Anion gap 9 5 - 15  Protime-INR     Status: None   Collection Time: 01/10/15 11:20 PM  Result Value Ref Range   Prothrombin Time 13.1 11.4 - 15.0 seconds   INR 0.97      Assessment & Plan  1. AAA (abdominal aortic aneurysm) without rupture [facility]) Records from vascular specialist at [facility] reviewed and discussed patient patient in detail. ED note from [facility] also reviewed in detail.  2. Benign hypertension.  Blood pressure moderately elevated, although significantly improved from the time he was in the ER. He has been taking decongestants for a recent  cold which may have contributed to the increase. Advised to check his blood pressure regularly and follow-up in one month.  3. Hyperlipidemia Obtain FLP and will restart on statin therapy. - Lipid Profile  4. CKD (chronic kidney disease) stage 3, GFR 30-59 ml/min  - Comprehensive Metabolic Panel (CMET)    Jazmine Heckman [facility] 01/28/2015 10:21 AM

## 2015-02-09 ENCOUNTER — Other Ambulatory Visit: Payer: Self-pay | Admitting: Family Medicine

## 2015-02-23 ENCOUNTER — Encounter: Payer: Self-pay | Admitting: Family Medicine

## 2015-02-24 ENCOUNTER — Ambulatory Visit (INDEPENDENT_AMBULATORY_CARE_PROVIDER_SITE_OTHER): Payer: Medicare Other | Admitting: Family Medicine

## 2015-02-24 ENCOUNTER — Encounter: Payer: Self-pay | Admitting: Family Medicine

## 2015-02-24 VITALS — BP 138/70 | HR 63 | Temp 98.5°F | Resp 18 | Ht 68.0 in | Wt 171.8 lb

## 2015-02-24 DIAGNOSIS — I10 Essential (primary) hypertension: Secondary | ICD-10-CM | POA: Diagnosis not present

## 2015-02-24 MED ORDER — AMLODIPINE BESYLATE 10 MG PO TABS: 10.0000 mg | ORAL_TABLET | Freq: Every day | ORAL | Status: DC

## 2015-02-24 NOTE — Progress Notes (Signed)
Name: John Knight   MRN: [id number]    DOB: 06-07-1940   Date:02/24/2015       Progress Note  Subjective  Chief Complaint  Chief Complaint  Patient presents with  . Follow-up    1 mo   . Hyperlipidemia  . Hypertension    Hypertension This is a chronic problem. The problem is controlled. Pertinent negatives include no blurred vision, chest pain, headaches, palpitations or shortness of breath. Past treatments include beta blockers and calcium channel blockers. Hypertensive end-organ damage includes CVA (Stroke Intraoperatively while have CEA). There is no history of CAD/MI.    Past Medical History  Diagnosis Date  . Hypertension   . Stroke (HCC)   . Hyperlipidemia   . AAA (abdominal aortic aneurysm) (HCC)   . Renal disorder   . Elevated PSA     Past Surgical History  Procedure Laterality Date  . Endarterectomy Right 05/2008  . Varicose vein surgery    . Hemorrhoid surgery      Family History  Problem Relation Age of Onset  . Heart attack Mother   . Cancer Father   . Hypertension Father   . Diabetes Brother     Social History   Social History  . Marital Status: Married    Spouse Name: N/A  . Number of Children: N/A  . Years of Education: N/A   Occupational History  . Not on file.   Social History Main Topics  . Smoking status: Current Every Day Smoker -- 1.00 packs/day    Types: Cigarettes  . Smokeless tobacco: Not on file  . Alcohol Use: No  . Drug Use: No  . Sexual Activity: Not on file   Other Topics Concern  . Not on file   Social History Narrative     Current outpatient prescriptions:  .  amLODipine (NORVASC) 10 MG tablet, Take 1 tablet (10 mg total) by mouth daily., Disp: 90 tablet, Rfl: 0 .  amLODipine (NORVASC) 10 MG tablet, TAKE 1 TABLET (10 MG TOTAL) BY MOUTH DAILY., Disp: 90 tablet, Rfl: 0 .  aspirin 81 MG chewable tablet, Chew 81 mg by mouth daily., Disp: , Rfl:  .  metoprolol (LOPRESSOR) 100 MG tablet, Take 1 tablet (100 mg total) by  mouth daily., Disp: 90 tablet, Rfl: 0 .  metoprolol (LOPRESSOR) 100 MG tablet, TAKE 1 TABLET (100 MG TOTAL) BY MOUTH EVERY MORNING., Disp: 90 tablet, Rfl: 0 .  aspirin-sod bicarb-citric acid (ALKA-SELTZER) 325 MG TBEF tablet, Take 325 mg by mouth every 6 (six) hours as needed. Reported on 02/24/2015, Disp: , Rfl:  .  oxyCODONE-acetaminophen (ROXICET) 5-325 MG tablet, Take 1 tablet by mouth every 4 (four) hours as needed for severe pain. (Patient not taking: Reported on 02/24/2015), Disp: 20 tablet, Rfl: 0  No Known Allergies   Review of Systems  Eyes: Negative for blurred vision.  Respiratory: Negative for shortness of breath.   Cardiovascular: Negative for chest pain and palpitations.  Neurological: Negative for headaches.    Objective  Filed Vitals:   02/24/15 0930  BP: 138/70  Pulse: 63  Temp: 98.5 F (36.9 C)  TempSrc: Oral  Resp: 18  Height:  (1.727 m)  Weight: 171 lb 12.8 oz (77.928 kg)  SpO2: 94%    Physical Exam  Constitutional: He is oriented to person, place, and time and well-developed, well-nourished, and in no distress.  Cardiovascular: Normal rate and regular rhythm.   Pulmonary/Chest: Effort normal and breath sounds normal.  Neurological: He is  alert and oriented to person, place, and time.  Nursing note and vitals reviewed.   Assessment & Plan  1. Benign hypertension BP well controlled on therapy. Refills for amlodipine provided. - amLODipine (NORVASC) 10 MG tablet; Take 1 tablet (10 mg total) by mouth daily.  Dispense: 90 tablet; Refill: 0   Kishia Shackett [facility] 02/24/2015 10:06 AM

## 2015-05-24 ENCOUNTER — Encounter: Payer: Self-pay | Admitting: Family Medicine

## 2015-05-24 ENCOUNTER — Ambulatory Visit (INDEPENDENT_AMBULATORY_CARE_PROVIDER_SITE_OTHER): Payer: Medicare Other | Admitting: Family Medicine

## 2015-05-24 VITALS — BP 140/70 | HR 112 | Temp 97.9°F | Resp 18 | Ht 68.0 in | Wt 171.1 lb

## 2015-05-24 DIAGNOSIS — I714 Abdominal aortic aneurysm, without rupture, unspecified: Secondary | ICD-10-CM

## 2015-05-24 DIAGNOSIS — N183 Chronic kidney disease, stage 3 unspecified: Secondary | ICD-10-CM

## 2015-05-24 DIAGNOSIS — E785 Hyperlipidemia, unspecified: Secondary | ICD-10-CM | POA: Diagnosis not present

## 2015-05-24 DIAGNOSIS — I10 Essential (primary) hypertension: Secondary | ICD-10-CM | POA: Diagnosis not present

## 2015-05-24 MED ORDER — AMLODIPINE BESYLATE 10 MG PO TABS: 10.0000 mg | ORAL_TABLET | Freq: Every day | ORAL | Status: DC

## 2015-05-24 MED ORDER — METOPROLOL TARTRATE 100 MG PO TABS: 100.0000 mg | ORAL_TABLET | Freq: Every day | ORAL | Status: DC

## 2015-05-24 NOTE — Progress Notes (Signed)
Name: John Knight   MRN: [id number]    DOB: 10-29-1940   Date:05/24/2015       Progress Note  Subjective  Chief Complaint  Chief Complaint  Patient presents with  . Hypertension    pt here for 3 month follow up    Hypertension This is a chronic problem. The problem is unchanged. The problem is controlled. Pertinent negatives include no blurred vision, chest pain, headaches, palpitations or shortness of breath. Past treatments include beta blockers and calcium channel blockers.  Hyperlipidemia This is a chronic problem. The problem is uncontrolled. Pertinent negatives include no chest pain or shortness of breath. He is currently on no antihyperlipidemic treatment (Has not taken statins, Lipid panel was ordered but not obtained.).    Past Medical History  Diagnosis Date  . Hypertension   . Stroke (HCC)   . Hyperlipidemia   . AAA (abdominal aortic aneurysm) (HCC)   . Renal disorder   . Elevated PSA     Past Surgical History  Procedure Laterality Date  . Endarterectomy Right 05/2008  . Varicose vein surgery    . Hemorrhoid surgery      Family History  Problem Relation Age of Onset  . Heart attack Mother   . Cancer Father   . Hypertension Father   . Diabetes Brother     Social History   Social History  . Marital Status: Married    Spouse Name: N/A  . Number of Children: N/A  . Years of Education: N/A   Occupational History  . Not on file.   Social History Main Topics  . Smoking status: Current Every Day Smoker -- 1.00 packs/day    Types: Cigarettes  . Smokeless tobacco: Not on file  . Alcohol Use: No  . Drug Use: No  . Sexual Activity: Not on file   Other Topics Concern  . Not on file   Social History Narrative     Current outpatient prescriptions:  .  amLODipine (NORVASC) 10 MG tablet, Take 1 tablet (10 mg total) by mouth daily., Disp: 90 tablet, Rfl: 0 .  aspirin 81 MG chewable tablet, Chew 81 mg by mouth daily., Disp: , Rfl:  .  aspirin-sod  bicarb-citric acid (ALKA-SELTZER) 325 MG TBEF tablet, Take 325 mg by mouth every 6 (six) hours as needed. Reported on 02/24/2015, Disp: , Rfl:  .  metoprolol (LOPRESSOR) 100 MG tablet, Take 1 tablet (100 mg total) by mouth daily., Disp: 90 tablet, Rfl: 0 .  oxyCODONE-acetaminophen (ROXICET) 5-325 MG tablet, Take 1 tablet by mouth every 4 (four) hours as needed for severe pain. (Patient not taking: Reported on 02/24/2015), Disp: 20 tablet, Rfl: 0  No Known Allergies   Review of Systems  Constitutional: Negative for fever and chills.  Eyes: Negative for blurred vision and double vision.  Respiratory: Negative for cough and shortness of breath.   Cardiovascular: Negative for chest pain and palpitations.  Gastrointestinal: Negative for abdominal pain.  Neurological: Negative for headaches.    Objective  Filed Vitals:   05/24/15 0935  BP: 140/70  Pulse: 112  Temp: 97.9 F (36.6 C)  Resp: 18  Height: 5\' 8"  (1.727 m)  Weight: 171 lb 2 oz (77.622 kg)  SpO2: 94%    Physical Exam  Constitutional: He is oriented to person, place, and time and well-developed, well-nourished, and in no distress.  HENT:  Head: Normocephalic and atraumatic.  Cardiovascular: Normal rate and regular rhythm.   Pulmonary/Chest: Effort normal and breath sounds normal.  Abdominal: Soft. Bowel sounds are normal.  Musculoskeletal: He exhibits no edema.  Neurological: He is alert and oriented to person, place, and time.  Nursing note and vitals reviewed.    Assessment & Plan  1. Benign hypertension Blood pressure is well controlled, continue on present therapy. - amLODipine (NORVASC) 10 MG tablet; Take 1 tablet (10 mg total) by mouth daily.  Dispense: 90 tablet; Refill: 0 - metoprolol (LOPRESSOR) 100 MG tablet; Take 1 tablet (100 mg total) by mouth daily.  Dispense: 90 tablet; Refill: 0  2. Hyperlipidemia Patient has stopped statin therapy, we will obtain fasting lipid panel and consider restarting. - Lipid  Profile  3. CKD (chronic kidney disease) stage 3, GFR 30-59 ml/min  - Comprehensive Metabolic Panel (CMET)  4. AAA (abdominal aortic aneurysm) without rupture [facility]) Patient is being followed by vascular surgery at [facility] and is not deemed an operable candidate.    Eline Geng Asad A. [facility] 05/24/2015 9:45 AM

## 2015-08-27 ENCOUNTER — Other Ambulatory Visit: Payer: Self-pay | Admitting: Family Medicine

## 2015-08-27 DIAGNOSIS — I10 Essential (primary) hypertension: Secondary | ICD-10-CM

## 2015-09-13 ENCOUNTER — Other Ambulatory Visit: Payer: Self-pay | Admitting: Family Medicine

## 2015-09-13 DIAGNOSIS — I10 Essential (primary) hypertension: Secondary | ICD-10-CM

## 2015-09-15 ENCOUNTER — Other Ambulatory Visit: Payer: Self-pay

## 2015-09-15 ENCOUNTER — Encounter: Payer: Self-pay | Admitting: Family Medicine

## 2015-09-15 ENCOUNTER — Ambulatory Visit: Payer: Medicare Other

## 2015-09-15 ENCOUNTER — Ambulatory Visit
Admission: RE | Admit: 2015-09-15 | Discharge: 2015-09-15 | Disposition: A | Discharge status: Home / Self Care | Payer: Medicare Other | Source: Ambulatory Visit | Attending: Family Medicine | Admitting: Family Medicine

## 2015-09-15 ENCOUNTER — Ambulatory Visit (INDEPENDENT_AMBULATORY_CARE_PROVIDER_SITE_OTHER): Payer: Medicare Other | Admitting: Family Medicine

## 2015-09-15 ENCOUNTER — Other Ambulatory Visit: Payer: Self-pay | Admitting: Family Medicine

## 2015-09-15 VITALS — BP 118/70 | HR 105 | Temp 97.9°F | Resp 16 | Ht 68.0 in | Wt 167.2 lb

## 2015-09-15 DIAGNOSIS — N261 Atrophy of kidney (terminal): Secondary | ICD-10-CM | POA: Diagnosis not present

## 2015-09-15 DIAGNOSIS — I714 Abdominal aortic aneurysm, without rupture, unspecified: Secondary | ICD-10-CM

## 2015-09-15 DIAGNOSIS — R1033 Periumbilical pain: Secondary | ICD-10-CM

## 2015-09-15 DIAGNOSIS — N2889 Other specified disorders of kidney and ureter: Secondary | ICD-10-CM | POA: Insufficient documentation

## 2015-09-15 DIAGNOSIS — E785 Hyperlipidemia, unspecified: Secondary | ICD-10-CM | POA: Diagnosis not present

## 2015-09-15 DIAGNOSIS — I10 Essential (primary) hypertension: Secondary | ICD-10-CM

## 2015-09-15 DIAGNOSIS — I700 Atherosclerosis of aorta: Secondary | ICD-10-CM | POA: Diagnosis not present

## 2015-09-15 DIAGNOSIS — M545 Low back pain, unspecified: Secondary | ICD-10-CM | POA: Insufficient documentation

## 2015-09-15 DIAGNOSIS — R739 Hyperglycemia, unspecified: Secondary | ICD-10-CM

## 2015-09-15 LAB — COMPLETE METABOLIC PANEL WITH GFR
ALBUMIN: 3.9 g/dL (ref 3.6–5.1)
ALT: 10 U/L (ref 9–46)
AST: 14 U/L (ref 10–35)
Alkaline Phosphatase: 126 U/L — ABNORMAL HIGH (ref 40–115)
BILIRUBIN TOTAL: 0.5 mg/dL (ref 0.2–1.2)
BUN: 22 mg/dL (ref 7–25)
CHLORIDE: 97 mmol/L — AB (ref 98–110)
CO2: 29 mmol/L (ref 20–31)
CREATININE: 1.51 mg/dL — AB (ref 0.70–1.18)
Calcium: 10 mg/dL (ref 8.6–10.3)
GFR, EST NON AFRICAN AMERICAN: 44 mL/min — AB (ref >=60)
GFR, Est African American: 51 mL/min — ABNORMAL LOW (ref >=60)
Glucose, Bld: 117 mg/dL — ABNORMAL HIGH (ref 65–99)
Potassium: 4.1 mmol/L (ref 3.5–5.3)
SODIUM: 140 mmol/L (ref 135–146)
Total Protein: 7.3 g/dL (ref 6.1–8.1)

## 2015-09-15 LAB — GLUCOSE, POCT (MANUAL RESULT ENTRY): POC GLUCOSE: 101 mg/dL — AB (ref 70–99)

## 2015-09-15 LAB — CBC WITH DIFFERENTIAL/PLATELET
Basophils Absolute: 0 cells/uL (ref 0–200)
Basophils Relative: 0 %
EOS PCT: 5 %
Eosinophils Absolute: 495 cells/uL (ref 15–500)
HCT: 47.3 % (ref 38.5–50.0)
HEMOGLOBIN: 16.1 g/dL (ref 13.2–17.1)
LYMPHS ABS: 2178 {cells}/uL (ref 850–3900)
Lymphocytes Relative: 22 %
MCH: 31 pg (ref 27.0–33.0)
MCHC: 34 g/dL (ref 32.0–36.0)
MCV: 91.1 fL (ref 80.0–100.0)
MONOS PCT: 9 %
MPV: 9.5 fL (ref 7.5–12.5)
Monocytes Absolute: 891 cells/uL (ref 200–950)
NEUTROS ABS: 6336 {cells}/uL (ref 1500–7800)
NEUTROS PCT: 64 %
PLATELETS: 206 10*3/uL (ref 140–400)
RBC: 5.19 MIL/uL (ref 4.20–5.80)
RDW: 13.6 % (ref 11.0–15.0)
WBC: 9.9 10*3/uL (ref 3.8–10.8)

## 2015-09-15 LAB — LIPID PANEL
Cholesterol: 215 mg/dL — ABNORMAL HIGH (ref 125–200)
HDL: 44 mg/dL (ref >=40)
LDL CALC: 142 mg/dL — AB (ref <130)
Total CHOL/HDL Ratio: 4.9 Ratio (ref <=5.0)
Triglycerides: 144 mg/dL (ref <150)
VLDL: 29 mg/dL (ref <30)

## 2015-09-15 LAB — POCT GLYCOSYLATED HEMOGLOBIN (HGB A1C): HEMOGLOBIN A1C: 5.9

## 2015-09-15 MED ORDER — METOPROLOL TARTRATE 100 MG PO TABS: 100.0000 mg | ORAL_TABLET | Freq: Every day | ORAL | 0 refills | Status: DC

## 2015-09-15 NOTE — Progress Notes (Signed)
Name: John Knight   MRN: [id number]    DOB: [birth year]   Date:09/15/2015       Progress Note  Subjective  Chief Complaint  Chief Complaint  Patient presents with  . Follow-up    hypertension, medication refills  . Back Pain  . Abdominal Pain    Back Pain  This is a new problem. The current episode started in the past 7 days. The pain is present in the lumbar spine. The quality of the pain is described as shooting. The pain does not radiate. The pain is at a severity of 7/10. Associated symptoms include abdominal pain. Pertinent negatives include no bladder incontinence, bowel incontinence, dysuria, fever, leg pain, numbness, paresis or weakness. He has tried analgesics (Tylenol) for the symptoms. The treatment provided moderate relief.  Abdominal Pain  This is a new problem. The current episode started in the past 7 days (1+ week ago). The pain is located in the periumbilical region. The pain is at a severity of 4/10. The quality of the pain is sharp. The abdominal pain does not radiate. Pertinent negatives include no anorexia, constipation, dysuria, fever, flatus, melena, nausea or vomiting. He has tried nothing for the symptoms.     Past Medical History:  Diagnosis Date  . AAA (abdominal aortic aneurysm) (HCC)   . Elevated PSA   . Hyperlipidemia   . Hypertension   . Renal disorder   . Stroke [facility])     Past Surgical History:  Procedure Laterality Date  . ENDARTERECTOMY Right 05/2008  . HEMORRHOID SURGERY    . VARICOSE VEIN SURGERY      Family History  Problem Relation Age of Onset  . Heart attack Mother   . Cancer Father   . Hypertension Father   . Diabetes Brother     Social History   Social History  . Marital status: Married    Spouse name: N/A  . Number of children: N/A  . Years of education: N/A   Occupational History  . Not on file.   Social History Main Topics  . Smoking status: Current Every Day Smoker    Packs/day: 1.00    Types: Cigarettes  .  Smokeless tobacco: Not on file  . Alcohol use No  . Drug use: No  . Sexual activity: Not on file   Other Topics Concern  . Not on file   Social History Narrative  . No narrative on file     Current Outpatient Prescriptions:  .  amLODipine (NORVASC) 10 MG tablet, TAKE 1 TABLET (10 MG TOTAL) BY MOUTH DAILY., Disp: 90 tablet, Rfl: 0 .  aspirin 81 MG chewable tablet, Chew 81 mg by mouth daily., Disp: , Rfl:  .  metoprolol (LOPRESSOR) 100 MG tablet, TAKE 1 TABLET (100 MG TOTAL) BY MOUTH DAILY., Disp: 90 tablet, Rfl: 0 .  aspirin-sod bicarb-citric acid (ALKA-SELTZER) 325 MG TBEF tablet, Take 325 mg by mouth every 6 (six) hours as needed. Reported on 02/24/2015, Disp: , Rfl:  .  oxyCODONE-acetaminophen (ROXICET) 5-325 MG tablet, Take 1 tablet by mouth every 4 (four) hours as needed for severe pain. (Patient not taking: Reported on 02/24/2015), Disp: 20 tablet, Rfl: 0  No Known Allergies   Review of Systems  Constitutional: Negative for fever.  Gastrointestinal: Positive for abdominal pain. Negative for anorexia, bowel incontinence, constipation, flatus, melena, nausea and vomiting.  Genitourinary: Negative for bladder incontinence and dysuria.  Musculoskeletal: Positive for back pain.  Neurological: Negative for weakness and numbness.  Objective  Vitals:   09/15/15 1015  BP: 118/70  Pulse: (!) 105  Resp: 16  Temp: 97.9 F (36.6 C)  TempSrc: Oral  SpO2: 95%  Weight: 167 lb 3.2 oz (75.8 kg)  Height: 5\' 8"  (1.727 m)    Physical Exam  Constitutional: He is well-developed, well-nourished, and in no distress.  Cardiovascular: Normal rate, regular rhythm, S1 normal, S2 normal and normal heart sounds.   No murmur heard. Pulmonary/Chest: Effort normal and breath sounds normal. He has no wheezes.  Abdominal: Soft. There is tenderness in the periumbilical area. There is CVA tenderness. There is no rebound.  Musculoskeletal:       Lumbar back: He exhibits tenderness, pain and spasm.         Back:  Psychiatric: Mood, memory, affect and judgment normal.  Nursing note and vitals reviewed.    Assessment & Plan  1. Abdominal pain, acute, periumbilical Broad differential, most serious among them is the possibility of AAA rupture (patient has documented history of AAA, [facility], not a surgical candidate because of risk of permanent renal  Injury). Pain lab work and urinalysis for evaluation. - CBC with Differential - COMPLETE METABOLIC PANEL WITH GFR - CT Abdomen Pelvis Wo Contrast; Future - Urinalysis, Routine w reflex microscopic  2. Acute bilateral low back pain without sciatica Likely muscle spasm, obtain x-ray of lumbar spine - DG Lumbar Spine Complete; Future  3. Benign hypertension  - metoprolol (LOPRESSOR) 100 MG tablet; Take 1 tablet (100 mg total) by mouth daily.  Dispense: 90 tablet; Refill: 0  4. Hyperlipidemia  - Lipid Profile  5. Hyperglycemia Hemoglobin A1c is 5.9% point-of-care glucose is 101 mg/dL. This is consistent with prediabetes, - POCT HgB A1C - POCT Glucose (CBG)   Kymir Coles Asad A. Faylene KurtzShah [facility] 09/15/2015 10:27 AM

## 2015-09-16 LAB — URINALYSIS, MICROSCOPIC ONLY
BACTERIA UA: NONE SEEN [HPF]
CRYSTALS: NONE SEEN [HPF]
RBC / HPF: NONE SEEN RBC/HPF (ref <=2)
Squamous Epithelial / LPF: NONE SEEN [HPF] (ref <=5)
WBC UA: NONE SEEN WBC/HPF (ref <=5)
Yeast: NONE SEEN [HPF]

## 2015-09-16 LAB — URINALYSIS, ROUTINE W REFLEX MICROSCOPIC
Bilirubin Urine: NEGATIVE
Glucose, UA: NEGATIVE
HGB URINE DIPSTICK: NEGATIVE
KETONES UR: NEGATIVE
Leukocytes, UA: NEGATIVE
NITRITE: NEGATIVE
Specific Gravity, Urine: 1.019 (ref 1.001–1.035)
pH: 6 (ref 5.0–8.0)

## 2015-09-23 ENCOUNTER — Telehealth: Payer: Self-pay

## 2015-09-23 MED ORDER — ROSUVASTATIN CALCIUM 10 MG PO TABS: 10.0000 mg | ORAL_TABLET | Freq: Every day | ORAL | 0 refills | Status: AC

## 2015-09-23 NOTE — Telephone Encounter (Signed)
Patient has been notified of lab results and a prescription for Crestor 10 mg at bedtime has been sent to [facility] [address] per Dr. Sherryll BurgerShah, patient has been notified and verbalized understanding

## 2015-10-04 ENCOUNTER — Encounter: Payer: Self-pay | Admitting: Emergency Medicine

## 2015-10-04 ENCOUNTER — Emergency Department
Admission: EM | Admit: 2015-10-04 | Discharge: 2015-10-05 | Disposition: A | Discharge status: Home / Self Care | Payer: Medicare Other | Attending: Emergency Medicine | Admitting: Emergency Medicine

## 2015-10-04 DIAGNOSIS — I129 Hypertensive chronic kidney disease with stage 1 through stage 4 chronic kidney disease, or unspecified chronic kidney disease: Secondary | ICD-10-CM | POA: Insufficient documentation

## 2015-10-04 DIAGNOSIS — N183 Chronic kidney disease, stage 3 (moderate): Secondary | ICD-10-CM | POA: Diagnosis not present

## 2015-10-04 DIAGNOSIS — Z7982 Long term (current) use of aspirin: Secondary | ICD-10-CM | POA: Diagnosis not present

## 2015-10-04 DIAGNOSIS — R1084 Generalized abdominal pain: Secondary | ICD-10-CM | POA: Diagnosis not present

## 2015-10-04 DIAGNOSIS — Z79899 Other long term (current) drug therapy: Secondary | ICD-10-CM | POA: Diagnosis not present

## 2015-10-04 DIAGNOSIS — F17210 Nicotine dependence, cigarettes, uncomplicated: Secondary | ICD-10-CM | POA: Insufficient documentation

## 2015-10-04 LAB — CBC WITH DIFFERENTIAL/PLATELET
Basophils Absolute: 0 10*3/uL (ref 0–0.1)
Basophils Relative: 0 %
EOS ABS: 0.6 10*3/uL (ref 0–0.7)
EOS PCT: 6 %
HCT: 45.8 % (ref 40.0–52.0)
HEMOGLOBIN: 15.6 g/dL (ref 13.0–18.0)
LYMPHS ABS: 2.2 10*3/uL (ref 1.0–3.6)
LYMPHS PCT: 23 %
MCH: 31.2 pg (ref 26.0–34.0)
MCHC: 34 g/dL (ref 32.0–36.0)
MCV: 91.7 fL (ref 80.0–100.0)
MONOS PCT: 8 %
Monocytes Absolute: 0.8 10*3/uL (ref 0.2–1.0)
NEUTROS PCT: 63 %
Neutro Abs: 6 10*3/uL (ref 1.4–6.5)
Platelets: 189 10*3/uL (ref 150–440)
RBC: 5 MIL/uL (ref 4.40–5.90)
RDW: 14.1 % (ref 11.5–14.5)
WBC: 9.6 10*3/uL (ref 3.8–10.6)

## 2015-10-04 LAB — COMPREHENSIVE METABOLIC PANEL
ALK PHOS: 130 U/L — AB (ref 38–126)
ALT: 11 U/L — AB (ref 17–63)
ANION GAP: 5 (ref 5–15)
AST: 18 U/L (ref 15–41)
Albumin: 3.9 g/dL (ref 3.5–5.0)
BUN: 32 mg/dL — ABNORMAL HIGH (ref 6–20)
CALCIUM: 9.5 mg/dL (ref 8.9–10.3)
CO2: 29 mmol/L (ref 22–32)
CREATININE: 1.79 mg/dL — AB (ref 0.61–1.24)
Chloride: 104 mmol/L (ref 101–111)
GFR, EST AFRICAN AMERICAN: 41 mL/min — AB (ref >60)
GFR, EST NON AFRICAN AMERICAN: 35 mL/min — AB (ref >60)
Glucose, Bld: 150 mg/dL — ABNORMAL HIGH (ref 65–99)
Potassium: 3.8 mmol/L (ref 3.5–5.1)
SODIUM: 138 mmol/L (ref 135–145)
TOTAL PROTEIN: 7.8 g/dL (ref 6.5–8.1)
Total Bilirubin: 0.5 mg/dL (ref 0.3–1.2)

## 2015-10-04 LAB — LIPASE, BLOOD: LIPASE: 47 U/L (ref 11–51)

## 2015-10-04 MED ORDER — BARIUM SULFATE 2.1 % PO SUSP
900.0000 mL | ORAL | Status: AC
  Administered 2015-10-04 – 2015-10-05 (×2): 900 mL via ORAL

## 2015-10-04 NOTE — ED Provider Notes (Signed)
[facility] Emergency Department Provider Note   ____________________________________________   I have reviewed the triage vital signs and the nursing notes.   HISTORY  Chief Complaint Abdominal Pain (Hx of Aneurysm)   History limited by: Not Limited   HPI John Knight is a 75 y.o. male who presents to the emergency department today because of concerns for abdominal pain. Patient states he has been having pain for the past week. It is intermittent. There has been times with the pain will be completely gone. He does describe it as being located in the central abdomen. He describes it as sharp. He states it is worse with eating. He states his last bowel movement was 2 days ago. He states he normally has bowel movements every day. He thinks he might of been seen some blood in his stool. No recent fevers. Does have a history of an abdominal aortic aneurysm.   Past Medical History:  Diagnosis Date  . AAA (abdominal aortic aneurysm) (HCC)   . Elevated PSA   . Hyperlipidemia   . Hypertension   . Renal disorder   . Stroke [facility](HCC)     Patient Active Problem List   Diagnosis Date Noted  . Abdominal pain, acute, periumbilical 09/15/2015  . Acute bilateral low back pain without sciatica 09/15/2015  . Hyperglycemia 09/15/2015  . CKD (chronic kidney disease) stage 3, GFR 30-59 ml/min 01/28/2015  . AAA (abdominal aortic aneurysm) without rupture (HCC) 08/16/2014  . Benign hypertension 08/16/2014  . Hyperlipidemia 08/16/2014  . Non-healing skin lesion 08/16/2014    Past Surgical History:  Procedure Laterality Date  . ENDARTERECTOMY Right 05/2008  . HEMORRHOID SURGERY    . VARICOSE VEIN SURGERY      Prior to Admission medications   Medication Sig Start Date End Date Taking? Authorizing Provider  amLODipine (NORVASC) 10 MG tablet TAKE 1 TABLET (10 MG TOTAL) BY MOUTH DAILY. 08/29/15  Yes Ellyn HackSyed Asad A Shah, MD  aspirin 81 MG chewable tablet Chew 81 mg by mouth  daily.   Yes Historical Provider, MD  aspirin-sod bicarb-citric acid (ALKA-SELTZER) 325 MG TBEF tablet Take 325 mg by mouth every 6 (six) hours as needed. Reported on 02/24/2015   Yes Historical Provider, MD  metoprolol (LOPRESSOR) 100 MG tablet Take 1 tablet (100 mg total) by mouth daily. 09/15/15  Yes Ellyn HackSyed Asad A Shah, MD  rosuvastatin (CRESTOR) 10 MG tablet Take 1 tablet (10 mg total) by mouth at bedtime. 09/23/15  Yes Ellyn HackSyed Asad A Shah, MD  oxyCODONE-acetaminophen (ROXICET) 5-325 MG tablet Take 1 tablet by mouth every 4 (four) hours as needed for severe pain. Patient not taking: Reported on 02/24/2015 01/11/15   Darci Currentandolph N Brown, MD    Allergies Contrast media [iodinated diagnostic agents]  Family History  Problem Relation Age of Onset  . Heart attack Mother   . Cancer Father   . Hypertension Father   . Diabetes Brother     Social History Social History  Substance Use Topics  . Smoking status: Current Every Day Smoker    Packs/day: 1.00    Types: Cigarettes  . Smokeless tobacco: Not on file  . Alcohol use No    Review of Systems  Constitutional: Negative for fever. Cardiovascular: Negative for chest pain. Respiratory: Negative for shortness of breath. Gastrointestinal: Positive for abdominal pain. Genitourinary: Negative for dysuria. Musculoskeletal: Negative for back pain. Skin: Negative for rash. Neurological: Negative for headaches, focal weakness or numbness.  10-point ROS otherwise negative.  ____________________________________________  PHYSICAL EXAM:  VITAL SIGNS: ED Triage Vitals  Enc Vitals Group     BP 10/04/15 2230 (!) 208/96     Pulse Rate 10/04/15 2230 98     Resp 10/04/15 2234 20     Temp 10/04/15 2234 98.2 F (36.8 C)     Temp Source 10/04/15 2234 Oral     SpO2 10/04/15 2230 96 %     Weight 10/04/15 2236 168 lb 1.6 oz (76.2 kg)     Height 10/04/15 2236 5\' 8"  (1.727 m)     Head Circumference --      Peak Flow --      Pain Score 10/04/15 2237 4    Constitutional: Alert and oriented. Well appearing and in no distress. Eyes: Conjunctivae are normal. Normal extraocular movements. ENT   Head: Normocephalic and atraumatic.   Nose: No congestion/rhinnorhea.   Mouth/Throat: Mucous membranes are moist.   Neck: No stridor. Hematological/Lymphatic/Immunilogical: No cervical lymphadenopathy. Cardiovascular: Normal rate, regular rhythm.  No murmurs, rubs, or gallops. Respiratory: Normal respiratory effort without tachypnea nor retractions. Breath sounds are clear and equal bilaterally. No wheezes/rales/rhonchi. Gastrointestinal: Soft and nontender. Palpable pulsating mass appreciated in abdomen.  Genitourinary: Deferred Musculoskeletal: Normal range of motion in all extremities. No lower extremity edema. Neurologic:  Normal speech and language. No gross focal neurologic deficits are appreciated.  Skin:  Skin is warm, dry and intact. No rash noted. Psychiatric: Mood and affect are normal. Speech and behavior are normal. Patient exhibits appropriate insight and judgment.  ____________________________________________    LABS (pertinent positives/negatives)  Labs Reviewed  COMPREHENSIVE METABOLIC PANEL - Abnormal; Notable for the following:       Result Value   Glucose, Bld 150 (*)    BUN 32 (*)    Creatinine, Ser 1.79 (*)    ALT 11 (*)    Alkaline Phosphatase 130 (*)    GFR calc non Af Amer 35 (*)    GFR calc Af Amer 41 (*)    All other components within normal limits  CBC WITH DIFFERENTIAL/PLATELET  LIPASE, BLOOD  URINALYSIS COMPLETEWITH MICROSCOPIC ([facility] ONLY)  UA pending at time of sign out   ____________________________________________   EKG  None  ____________________________________________    RADIOLOGY  CT abd pending ____________________________________________   PROCEDURES  Procedures  ____________________________________________   INITIAL IMPRESSION / ASSESSMENT AND PLAN / ED  COURSE  Pertinent labs & imaging results that were available during my care of the patient were reviewed by me and considered in my medical decision making (see chart for details).  Patient presents to the emergency department today with concerns for abdominal pain. It has been intermittent for the past week. Patient does have a history of abdominal aneurysm. At this point I have a lower suspicion that the pain is coming from the abdominal aneurysm given intermittent nature and the fact that is worse with eating. His abdomin additionally is significantly tender. Will however plan on getting blood work, urine and ct scan. Patient has an allergy to contrast medium.  ____________________________________________   FINAL CLINICAL IMPRESSION(S) / ED DIAGNOSES  Abdominal pain.  Note: This dictation was prepared with Dragon dictation. Any transcriptional errors that result from this process are unintentional    Phineas Semen, MD 10/05/15 0009

## 2015-10-04 NOTE — ED Triage Notes (Signed)
Pt stating that he began with abdominal pain 2 weeks ago. Pt had diarrhea that was black in color. Pt stating he has not had a BM in 2 days as of now. Pt has hx of aneurysm 6.7 in size. Pt denying N/V. Dr. Derrill KayGoodman at bedside. Pt has had some LLE decreased sensitivity.

## 2015-10-05 ENCOUNTER — Emergency Department: Payer: Medicare Other

## 2015-10-05 MED ORDER — TRAMADOL HCL 50 MG PO TABS: 50.0000 mg | ORAL_TABLET | Freq: Four times a day (QID) | ORAL | 0 refills | Status: AC | PRN

## 2015-10-05 MED ORDER — PANTOPRAZOLE SODIUM 40 MG PO TBEC: 40.0000 mg | DELAYED_RELEASE_TABLET | Freq: Every day | ORAL | 1 refills | Status: AC

## 2015-10-05 MED ORDER — POLYETHYLENE GLYCOL 3350 17 G PO PACK: 17.0000 g | PACK | Freq: Every day | ORAL | 0 refills | Status: AC

## 2015-10-05 NOTE — ED Provider Notes (Signed)
IMPRESSION: 1. Unchanged size of abdominal aortic aneurysm from exam 3 weeks prior, maximal dimension 7.5 x 6.6 cm. No evidence of aortic rupture. 2. Distal esophageal thickening with enteric contrast in the distal esophagus, suggesting reflux. Presumed ingested material within the stomach. 3. No acute abnormality in the abdomen/pelvis.  I went over the CT scan results with the patient, he is currently under the care of the vascular surgeon. He may have acid reflux issues as well as some constipation. I will place him on Protonix, MiraLAX and tramadol. He is stable for outpatient follow-up.    John FilbertJonathan E Williams, MD 10/05/15 [phone]

## 2015-10-05 NOTE — ED Notes (Signed)
Pt returned from CT °

## 2015-10-20 ENCOUNTER — Other Ambulatory Visit: Payer: Self-pay | Admitting: Family Medicine

## 2015-11-07 ENCOUNTER — Ambulatory Visit (INDEPENDENT_AMBULATORY_CARE_PROVIDER_SITE_OTHER): Payer: Medicare Other | Admitting: Family Medicine

## 2015-11-07 VITALS — BP 162/82 | HR 60 | Temp 96.9°F | Ht 66.0 in | Wt 162.2 lb

## 2015-11-07 DIAGNOSIS — N183 Chronic kidney disease, stage 3 unspecified: Secondary | ICD-10-CM

## 2015-11-07 DIAGNOSIS — Z0000 Encounter for general adult medical examination without abnormal findings: Secondary | ICD-10-CM | POA: Diagnosis not present

## 2015-11-07 DIAGNOSIS — I10 Essential (primary) hypertension: Secondary | ICD-10-CM | POA: Diagnosis not present

## 2015-11-07 DIAGNOSIS — E785 Hyperlipidemia, unspecified: Secondary | ICD-10-CM | POA: Diagnosis not present

## 2015-11-07 NOTE — Progress Notes (Signed)
Name: John Knight   MRN: [id number]    DOB: 1940/03/29   Date:11/07/2015       Progress Note  Subjective  Chief Complaint  No chief complaint on file.   Hypertension  This is a chronic problem. The problem is unchanged. The problem is uncontrolled. Pertinent negatives include no blurred vision, chest pain, headaches, palpitations or shortness of breath. Past treatments include beta blockers and calcium channel blockers. Hypertensive end-organ damage includes CVA (intraoperative CVA during L. CEA.). There is no history of kidney disease or CAD/MI.  Hyperlipidemia  This is a chronic problem. The problem is uncontrolled. Recent lipid tests were reviewed and are high. Pertinent negatives include no chest pain or shortness of breath. He is currently on no antihyperlipidemic treatment.      Past Medical History:  Diagnosis Date  . AAA (abdominal aortic aneurysm) (HCC)   . Elevated PSA   . Hyperlipidemia   . Hypertension   . Renal disorder   . Stroke [facility])     Past Surgical History:  Procedure Laterality Date  . ENDARTERECTOMY Right 05/2008  . HEMORRHOID SURGERY    . VARICOSE VEIN SURGERY      Family History  Problem Relation Age of Onset  . Heart attack Mother   . Cancer Father   . Hypertension Father   . Diabetes Brother     Social History   Social History  . Marital status: Married    Spouse name: N/A  . Number of children: N/A  . Years of education: N/A   Occupational History  . Not on file.   Social History Main Topics  . Smoking status: Current Every Day Smoker    Packs/day: 1.00    Years: 60.00    Types: Cigarettes  . Smokeless tobacco: Never Used  . Alcohol use No  . Drug use: No  . Sexual activity: Not on file   Other Topics Concern  . Not on file   Social History Narrative  . No narrative on file     Current Outpatient Prescriptions:  .  acetaminophen (TYLENOL) 325 MG tablet, Take 650 mg by mouth every 6 (six) hours as needed., Disp: ,  Rfl:  .  amLODipine (NORVASC) 10 MG tablet, TAKE 1 TABLET (10 MG TOTAL) BY MOUTH DAILY., Disp: 90 tablet, Rfl: 0 .  aspirin 81 MG chewable tablet, Chew 81 mg by mouth daily., Disp: , Rfl:  .  aspirin-sod bicarb-citric acid (ALKA-SELTZER) 325 MG TBEF tablet, Take 325 mg by mouth every 6 (six) hours as needed. Reported on 02/24/2015, Disp: , Rfl:  .  metoprolol (LOPRESSOR) 100 MG tablet, Take 1 tablet (100 mg total) by mouth daily., Disp: 90 tablet, Rfl: 0 .  oxyCODONE-acetaminophen (ROXICET) 5-325 MG tablet, Take 1 tablet by mouth every 4 (four) hours as needed for severe pain. (Patient not taking: Reported on 11/07/2015), Disp: 20 tablet, Rfl: 0 .  pantoprazole (PROTONIX) 40 MG tablet, Take 1 tablet (40 mg total) by mouth daily. (Patient not taking: Reported on 11/07/2015), Disp: 30 tablet, Rfl: 1 .  polyethylene glycol (MIRALAX / GLYCOLAX) packet, Take 17 g by mouth daily. (Patient not taking: Reported on 11/07/2015), Disp: 14 each, Rfl: 0 .  rosuvastatin (CRESTOR) 10 MG tablet, Take 1 tablet (10 mg total) by mouth at bedtime. (Patient not taking: Reported on 11/07/2015), Disp: 90 tablet, Rfl: 0 .  traMADol (ULTRAM) 50 MG tablet, Take 1 tablet (50 mg total) by mouth every 6 (six) hours as needed. (Patient not  taking: Reported on 11/07/2015), Disp: 20 tablet, Rfl: 0  Allergies  Allergen Reactions  . Contrast Media [Iodinated Diagnostic Agents]      Review of Systems  Eyes: Negative for blurred vision.  Respiratory: Negative for shortness of breath.   Cardiovascular: Negative for chest pain and palpitations.  Neurological: Negative for headaches.    Objective  There were no vitals filed for this visit.  Physical Exam  Constitutional: He is oriented to person, place, and time and well-developed, well-nourished, and in no distress.  HENT:  Head: Normocephalic and atraumatic.  Cardiovascular: Normal rate, regular rhythm and normal heart sounds.   No murmur heard. Pulmonary/Chest: Effort  normal and breath sounds normal. He has no wheezes.  Musculoskeletal:  Trace pitting edema bilateral lower extremities.  Neurological: He is alert and oriented to person, place, and time.  Psychiatric: Mood, memory, affect and judgment normal.  Nursing note and vitals reviewed.    Assessment & Plan  1. Benign hypertension Elevated blood pressure could be secondary to anxiety. Recommend ambulatory blood pressure monitoring for 2 weeks, recheck and follow-up.  2. Hyperlipidemia, unspecified hyperlipidemia type Has not started taking Crestor for hyperlipidemia. Educated on importance of lipid management and advised to start Crestor, recheck  FLP in 3 months  3. CKD (chronic kidney disease) stage 3, GFR 30-59 ml/min Recommended that he should follow up with nephrology for management.   John Knight John Knight A. John KurtzShah [facility] 11/07/2015 11:48 AM

## 2015-11-07 NOTE — Patient Instructions (Signed)
Mr. John Knight , Thank you for taking time to come for your Medicare Wellness Visit. I appreciate your ongoing commitment to your health goals. Please review the following plan we discussed and let me know if I can assist you in the future.   These are the goals we discussed: Goals    . Increase water intake          Starting 11/07/15, I will start drinking 2 glasses of water a day.       This is a list of the screening recommended for you and due dates:  Health Maintenance  Topic Date Due  . Flu Shot  11/06/2016*  . Pneumonia vaccines (1 of 2 - PCV13) 11/06/2016*  . Shingles Vaccine  11/07/2026*  . Tetanus Vaccine  10/03/2022  *Topic was postponed. The date shown is not the original due date.   Preventive Care for Adults  A healthy lifestyle and preventive care can promote health and wellness. Preventive health guidelines for adults include the following key practices.  . A routine yearly physical is a good way to check with your health care provider about your health and preventive screening. It is a chance to share any concerns and updates on your health and to receive a thorough exam.  . Visit your dentist for a routine exam and preventive care every 6 months. Brush your teeth twice a day and floss once a day. Good oral hygiene prevents tooth decay and gum disease.  . The frequency of eye exams is based on your age, health, family medical history, use  of contact lenses, and other factors. Follow your health care provider's ecommendations for frequency of eye exams.  . Eat a healthy diet. Foods like vegetables, fruits, whole grains, low-fat dairy products, and lean protein foods contain the nutrients you need without too many calories. Decrease your intake of foods high in solid fats, added sugars, and salt. Eat the right amount of calories for you. Get information about a proper diet from your health care provider, if necessary.  . Regular physical exercise is one of the most  important things you can do for your health. Most adults should get at least 150 minutes of moderate-intensity exercise (any activity that increases your heart rate and causes you to sweat) each week. In addition, most adults need muscle-strengthening exercises on 2 or more days a week.  Silver Sneakers may be a benefit available to you. To determine eligibility, you may visit the website: www.silversneakers.com or contact program at [phone] Mon-Fri between 8AM-8PM.   . Maintain a healthy weight. The body mass index (BMI) is a screening tool to identify possible weight problems. It provides an estimate of body fat based on height and weight. Your health care provider can find your BMI and can help you achieve or maintain a healthy weight.   For adults 20 years and older: ? A BMI below 18.5 is considered underweight. ? A BMI of 18.5 to 24.9 is normal. ? A BMI of 25 to 29.9 is considered overweight. ? A BMI of 30 and above is considered obese.   . Maintain normal blood lipids and cholesterol levels by exercising and minimizing your intake of saturated fat. Eat a balanced diet with plenty of fruit and vegetables. Blood tests for lipids and cholesterol should begin at age 76 and be repeated every 5 years. If your lipid or cholesterol levels are high, you are over 50, or you are at high risk for heart disease, you may need  your cholesterol levels checked more frequently. Ongoing high lipid and cholesterol levels should be treated with medicines if diet and exercise are not working.  . If you smoke, find out from your health care provider how to quit. If you do not use tobacco, please do not start.  . If you choose to drink alcohol, please do not consume more than 2 drinks per day. One drink is considered to be 12 ounces (355 mL) of beer, 5 ounces (148 mL) of wine, or 1.5 ounces (44 mL) of liquor.  . If you are 46-36 years old, ask your health care provider if you should take aspirin to prevent  strokes.  . Use sunscreen. Apply sunscreen liberally and repeatedly throughout the day. You should seek shade when your shadow is shorter than you. Protect yourself by wearing long sleeves, pants, a wide-brimmed hat, and sunglasses year round, whenever you are outdoors.  . Once a month, do a whole body skin exam, using a mirror to look at the skin on your back. Tell your health care provider of new moles, moles that have irregular borders, moles that are larger than a pencil eraser, or moles that have changed in shape or color.

## 2015-11-07 NOTE — Progress Notes (Signed)
Subjective:   John Knight is a 75 y.o. male who presents for Medicare Annual/Subsequent preventive examination.  Review of Systems:  N/A Cardiac Risk Factors include: advanced age (>6055men, 68>65 women);dyslipidemia;hypertension;male gender;smoking/ tobacco exposure     Objective:    Vitals: BP (!) 162/82   Pulse 60   Temp (!) 96.9 F (36.1 C)   Ht 5\' 6"  (1.676 m)   Wt 162 lb 4 oz (73.6 kg)   BMI 26.19 kg/m   Body mass index is 26.19 kg/m.  Tobacco History  Smoking Status  . Current Every Day Smoker  . Packs/day: 1.00  . Years: 60.00  . Types: Cigarettes  Smokeless Tobacco  . Never Used     Ready to quit: No Counseling given: No   Past Medical History:  Diagnosis Date  . AAA (abdominal aortic aneurysm) (HCC)   . Elevated PSA   . Hyperlipidemia   . Hypertension   . Renal disorder   . Stroke [facility](HCC)    Past Surgical History:  Procedure Laterality Date  . ENDARTERECTOMY Right 05/2008  . HEMORRHOID SURGERY    . VARICOSE VEIN SURGERY     Family History  Problem Relation Age of Onset  . Heart attack Mother   . Cancer Father   . Hypertension Father   . Diabetes Brother    History  Sexual Activity  . Sexual activity: Not on file    Outpatient Encounter Prescriptions as of 11/07/2015  Medication Sig  . acetaminophen (TYLENOL) 325 MG tablet Take 650 mg by mouth every 6 (six) hours as needed.  Marland Kitchen. amLODipine (NORVASC) 10 MG tablet TAKE 1 TABLET (10 MG TOTAL) BY MOUTH DAILY.  Marland Kitchen. aspirin 81 MG chewable tablet Chew 81 mg by mouth daily.  Marland Kitchen. aspirin-sod bicarb-citric acid (ALKA-SELTZER) 325 MG TBEF tablet Take 325 mg by mouth every 6 (six) hours as needed. Reported on 02/24/2015  . metoprolol (LOPRESSOR) 100 MG tablet Take 1 tablet (100 mg total) by mouth daily.  Marland Kitchen. oxyCODONE-acetaminophen (ROXICET) 5-325 MG tablet Take 1 tablet by mouth every 4 (four) hours as needed for severe pain. (Patient not taking: Reported on 11/07/2015)  . pantoprazole (PROTONIX) 40 MG tablet  Take 1 tablet (40 mg total) by mouth daily. (Patient not taking: Reported on 11/07/2015)  . polyethylene glycol (MIRALAX / GLYCOLAX) packet Take 17 g by mouth daily. (Patient not taking: Reported on 11/07/2015)  . rosuvastatin (CRESTOR) 10 MG tablet Take 1 tablet (10 mg total) by mouth at bedtime. (Patient not taking: Reported on 11/07/2015)  . traMADol (ULTRAM) 50 MG tablet Take 1 tablet (50 mg total) by mouth every 6 (six) hours as needed. (Patient not taking: Reported on 11/07/2015)   No facility-administered encounter medications on file as of 11/07/2015.     Activities of Daily Living In your present state of health, do you have any difficulty performing the following activities: 11/07/2015 02/24/2015  Hearing? N N  Vision? N N  Difficulty concentrating or making decisions? Y N  Walking or climbing stairs? N N  Dressing or bathing? N N  Doing errands, shopping? N N  Preparing Food and eating ? N -  Using the Toilet? N -  In the past six months, have you accidently leaked urine? N -  Do you have problems with loss of bowel control? Y -  Managing your Medications? N -  Managing your Finances? N -  Housekeeping or managing your Housekeeping? Y -  Some recent data might be hidden  Patient Care Team: Ellyn Hack, MD as PCP - General (Family Medicine) Suszanne Finch, MD as Attending Physician (Vascular Surgery)   Assessment:     Exercise Activities and Dietary recommendations Current Exercise Habits: The patient does not participate in regular exercise at present, Exercise limited by: orthopedic condition(s)  Goals    . Increase water intake          Starting 11/07/15, I will start drinking 2 glasses of water a day.      Fall Risk Fall Risk  11/07/2015 01/28/2015 08/16/2014  Falls in the past year? No No No   Depression Screen PHQ 2/9 Scores 11/07/2015 01/28/2015 08/16/2014  PHQ - 2 Score 3 0 0  PHQ- 9 Score 8 - -    Cognitive Testing MMSE - Mini Mental State Exam  11/07/2015  Orientation to time 5  Orientation to Place 5  Registration 3  Attention/ Calculation 5  Recall 3  Language- name 2 objects 2  Language- repeat 1  Language- follow 3 step command 3  Language- read & follow direction 0  Write a sentence 0  Copy design 0  Total score 27    Immunization History  Administered Date(s) Administered  . Tdap 10/02/2012   Screening Tests Health Maintenance  Topic Date Due  . INFLUENZA VACCINE  11/06/2016 (Originally 08/23/2015)  . PNA vac Low Risk Adult (1 of 2 - PCV13) 11/06/2016 (Originally 05/07/2004)  . ZOSTAVAX  11/07/2026 (Originally 05/08/1999)  . TETANUS/TDAP  10/03/2022      Plan:    I have personally reviewed and addressed the Medicare Annual Wellness questionnaire and have noted the following in the patient's chart:  A. Medical and social history B. Use of alcohol, tobacco or illicit drugs  C. Current medications and supplements D. Functional ability and status E.  Nutritional status F.  Physical activity G. Advance directives H. List of other physicians I.  Hospitalizations, surgeries, and ER visits in previous 12 months J.  Vitals K. Screenings such as hearing and vision if needed, cognitive and depression L. Referrals and appointments - none  In addition, I have reviewed and discussed with patient certain preventive protocols, quality metrics, and best practice recommendations. A written personalized care plan for preventive services as well as general preventive health recommendations were provided to patient.  See attached scanned questionnaire for additional information.   Signed,  Hyacinth Meeker, LPN Nurse Health Advisor

## 2015-11-22 ENCOUNTER — Ambulatory Visit: Payer: Medicare Other | Admitting: Family Medicine

## 2015-11-23 ENCOUNTER — Ambulatory Visit: Payer: Medicare Other | Admitting: Family Medicine

## 2015-11-25 ENCOUNTER — Other Ambulatory Visit: Payer: Self-pay | Admitting: Family Medicine

## 2015-11-25 DIAGNOSIS — I10 Essential (primary) hypertension: Secondary | ICD-10-CM

## 2015-11-26 ENCOUNTER — Emergency Department: Payer: Medicare Other

## 2015-11-26 ENCOUNTER — Emergency Department
Admission: EM | Admit: 2015-11-26 | Discharge: 2015-11-26 | Disposition: A | Discharge status: Home / Self Care | Payer: Medicare Other | Attending: Emergency Medicine | Admitting: Emergency Medicine

## 2015-11-26 DIAGNOSIS — N183 Chronic kidney disease, stage 3 (moderate): Secondary | ICD-10-CM | POA: Insufficient documentation

## 2015-11-26 DIAGNOSIS — F17210 Nicotine dependence, cigarettes, uncomplicated: Secondary | ICD-10-CM | POA: Insufficient documentation

## 2015-11-26 DIAGNOSIS — R109 Unspecified abdominal pain: Secondary | ICD-10-CM | POA: Diagnosis present

## 2015-11-26 DIAGNOSIS — I129 Hypertensive chronic kidney disease with stage 1 through stage 4 chronic kidney disease, or unspecified chronic kidney disease: Secondary | ICD-10-CM | POA: Insufficient documentation

## 2015-11-26 DIAGNOSIS — Z79899 Other long term (current) drug therapy: Secondary | ICD-10-CM | POA: Insufficient documentation

## 2015-11-26 DIAGNOSIS — Z7982 Long term (current) use of aspirin: Secondary | ICD-10-CM | POA: Diagnosis not present

## 2015-11-26 LAB — CBC WITH DIFFERENTIAL/PLATELET
Basophils Absolute: 0 10*3/uL (ref 0–0.1)
Basophils Relative: 0 %
EOS ABS: 0.4 10*3/uL (ref 0–0.7)
Eosinophils Relative: 4 %
HEMATOCRIT: 44.6 % (ref 40.0–52.0)
HEMOGLOBIN: 15.4 g/dL (ref 13.0–18.0)
LYMPHS ABS: 2 10*3/uL (ref 1.0–3.6)
Lymphocytes Relative: 19 %
MCH: 31.5 pg (ref 26.0–34.0)
MCHC: 34.6 g/dL (ref 32.0–36.0)
MCV: 90.9 fL (ref 80.0–100.0)
Monocytes Absolute: 0.9 10*3/uL (ref 0.2–1.0)
Monocytes Relative: 8 %
NEUTROS ABS: 7 10*3/uL — AB (ref 1.4–6.5)
NEUTROS PCT: 69 %
Platelets: 170 10*3/uL (ref 150–440)
RBC: 4.91 MIL/uL (ref 4.40–5.90)
RDW: 13.8 % (ref 11.5–14.5)
WBC: 10.3 10*3/uL (ref 3.8–10.6)

## 2015-11-26 LAB — BASIC METABOLIC PANEL
Anion gap: 8 (ref 5–15)
BUN: 29 mg/dL — AB (ref 6–20)
CHLORIDE: 101 mmol/L (ref 101–111)
CO2: 28 mmol/L (ref 22–32)
Calcium: 9.6 mg/dL (ref 8.9–10.3)
Creatinine, Ser: 1.56 mg/dL — ABNORMAL HIGH (ref 0.61–1.24)
GFR calc non Af Amer: 41 mL/min — ABNORMAL LOW (ref >60)
GFR, EST AFRICAN AMERICAN: 48 mL/min — AB (ref >60)
Glucose, Bld: 119 mg/dL — ABNORMAL HIGH (ref 65–99)
POTASSIUM: 3.7 mmol/L (ref 3.5–5.1)
SODIUM: 137 mmol/L (ref 135–145)

## 2015-11-26 MED ORDER — DICYCLOMINE HCL 20 MG PO TABS: 20.0000 mg | ORAL_TABLET | Freq: Three times a day (TID) | ORAL | 0 refills | Status: AC | PRN

## 2015-11-26 NOTE — Discharge Instructions (Signed)
Please seek medical attention for any high fevers, chest pain, shortness of breath, change in behavior, persistent vomiting, bloody stool or any other new or concerning symptoms.  

## 2015-11-26 NOTE — ED Provider Notes (Signed)
[facility] Emergency Department Provider Note   ____________________________________________   I have reviewed the triage vital signs and the nursing notes.   HISTORY  Chief Complaint Abdominal Pain   History limited by: Not Limited   HPI John Knight is a 75 y.o. male who presents to the emergency department today with continued abdominal pain. The pain is located centrally, with the central part of the pain being located just below his belly button. The patient has had the pain for a number of months and I personally evaluated him in the emergency department two months ago for this pain. He has had some nausea with the pain recently but no vomiting. He has not noticed any change in his stool recently. Denies any fevers. Has not followed up with a GI doctor.Patient does have a history of a AAA and is followed at [facility].   Past Medical History:  Diagnosis Date  . AAA (abdominal aortic aneurysm) (HCC)   . Elevated PSA   . Hyperlipidemia   . Hypertension   . Renal disorder   . Stroke [facility])     Patient Active Problem List   Diagnosis Date Noted  . Abdominal pain, acute, periumbilical 09/15/2015  . Acute bilateral low back pain without sciatica 09/15/2015  . Hyperglycemia 09/15/2015  . CKD (chronic kidney disease) stage 3, GFR 30-59 ml/min 01/28/2015  . AAA (abdominal aortic aneurysm) without rupture (HCC) 08/16/2014  . Benign hypertension 08/16/2014  . Hyperlipidemia 08/16/2014  . Non-healing skin lesion 08/16/2014    Past Surgical History:  Procedure Laterality Date  . ENDARTERECTOMY Right 05/2008  . HEMORRHOID SURGERY    . VARICOSE VEIN SURGERY      Prior to Admission medications   Medication Sig Start Date End Date Taking? Authorizing Provider  acetaminophen (TYLENOL) 325 MG tablet Take 650 mg by mouth every 6 (six) hours as needed.    Historical Provider, MD  amLODipine (NORVASC) 10 MG tablet TAKE 1 TABLET (10 MG TOTAL) BY MOUTH DAILY.  11/25/15   Ellyn Hack, MD  aspirin 81 MG chewable tablet Chew 81 mg by mouth daily.    Historical Provider, MD  aspirin-sod bicarb-citric acid (ALKA-SELTZER) 325 MG TBEF tablet Take 325 mg by mouth every 6 (six) hours as needed. Reported on 02/24/2015    Historical Provider, MD  metoprolol (LOPRESSOR) 100 MG tablet Take 1 tablet (100 mg total) by mouth daily. 09/15/15   Ellyn Hack, MD  oxyCODONE-acetaminophen (ROXICET) 5-325 MG tablet Take 1 tablet by mouth every 4 (four) hours as needed for severe pain. Patient not taking: Reported on 11/07/2015 01/11/15   Darci Current, MD  pantoprazole (PROTONIX) 40 MG tablet Take 1 tablet (40 mg total) by mouth daily. Patient not taking: Reported on 11/07/2015 10/05/15 10/04/16  Emily Filbert, MD  polyethylene glycol [facility]) packet Take 17 g by mouth daily. Patient not taking: Reported on 11/07/2015 10/05/15   Emily Filbert, MD  rosuvastatin (CRESTOR) 10 MG tablet Take 1 tablet (10 mg total) by mouth at bedtime. Patient not taking: Reported on 11/07/2015 09/23/15   Ellyn Hack, MD  traMADol (ULTRAM) 50 MG tablet Take 1 tablet (50 mg total) by mouth every 6 (six) hours as needed. Patient not taking: Reported on 11/07/2015 10/05/15 10/04/16  Emily Filbert, MD    Allergies Contrast media [iodinated diagnostic agents]  Family History  Problem Relation Age of Onset  . Heart attack Mother   . Cancer  Father   . Hypertension Father   . Diabetes Brother     Social History Social History  Substance Use Topics  . Smoking status: Current Every Day Smoker    Packs/day: 1.00    Years: 60.00    Types: Cigarettes  . Smokeless tobacco: Never Used  . Alcohol use No    Review of Systems  Constitutional: Negative for fever. Cardiovascular: Negative for chest pain. Respiratory: Negative for shortness of breath. Gastrointestinal: Positive for abdominal pain.  Genitourinary: Negative for dysuria. Musculoskeletal:  Negative for back pain. Skin: Negative for rash. Neurological: Negative for headaches, focal weakness or numbness.  10-point ROS otherwise negative.  ____________________________________________   PHYSICAL EXAM:  VITAL SIGNS: ED Triage Vitals [11/26/15 0858]  Enc Vitals Group     BP 162/82     Pulse 60     Resp 15     Temp 96.9     Temp src      SpO2 95     Weight 162 lb (73.5 kg)     Height 5\' 8"  (1.727 m)     Head Circumference      Peak Flow      Pain Score 6   Constitutional: Alert and oriented. Well appearing and in no distress. Eyes: Conjunctivae are normal. Normal extraocular movements. ENT   Head: Normocephalic and atraumatic.   Nose: No congestion/rhinnorhea.   Mouth/Throat: Mucous membranes are moist.   Neck: No stridor. Hematological/Lymphatic/Immunilogical: No cervical lymphadenopathy. Cardiovascular: Normal rate, regular rhythm.  No murmurs, rubs, or gallops.  Respiratory: Normal respiratory effort without tachypnea nor retractions. Breath sounds are clear and equal bilaterally. No wheezes/rales/rhonchi. Gastrointestinal: Soft and nontender. No distention.  Genitourinary: Deferred Musculoskeletal: Normal range of motion in all extremities. No lower extremity edema. Neurologic:  Normal speech and language. No gross focal neurologic deficits are appreciated.  Skin:  Skin is warm, dry and intact. No rash noted. Psychiatric: Mood and affect are normal. Speech and behavior are normal. Patient exhibits appropriate insight and judgment.  ____________________________________________    LABS (pertinent positives/negatives)  Labs Reviewed  CBC WITH DIFFERENTIAL/PLATELET - Abnormal; Notable for the following:       Result Value   Neutro Abs 7.0 (*)    All other components within normal limits  BASIC METABOLIC PANEL - Abnormal; Notable for the following:    Glucose, Bld 119 (*)    BUN 29 (*)    Creatinine, Ser 1.56 (*)    GFR calc non Af Amer 41  (*)    GFR calc Af Amer 48 (*)    All other components within normal limits     ____________________________________________   EKG  I, Phineas SemenGraydon Angellica Maddison, attending physician, personally viewed and interpreted this EKG  EKG Time: 0902 Rate: 61 Rhythm: normal sinus rhythm with 1st degree av block Axis: left axis deviation Intervals: qtc 444 QRS: narrow, q waves V1-V4 ST changes: no st elevation Impression: abnormal ekg   ____________________________________________    RADIOLOGY  CT abd w/o contrast IMPRESSION:  Stable abdominal aortic aneurysm.    No acute abnormality is noted.     ____________________________________________   PROCEDURES  Procedures  ____________________________________________   INITIAL IMPRESSION / ASSESSMENT AND PLAN / ED COURSE  Pertinent labs & imaging results that were available during my care of the patient were reviewed by me and considered in my medical decision making (see chart for details).  Patient here with continued and somewhat chronic abdominal pain. With history of AAA ct scan was done  which did not show any significant increase in size. At this point given that patient is not in extremis do feel it is extremely unlikely that the AAA is the cause of the patient's symptoms. Will discharge to follow up with PCP. Patient has appointment scheduled.  ____________________________________________   FINAL CLINICAL IMPRESSION(S) / ED DIAGNOSES  Final diagnoses:  Abdominal pain, unspecified abdominal location     Note: This dictation was prepared with Dragon dictation. Any transcriptional errors that result from this process are unintentional    Phineas SemenGraydon Michelene Keniston, MD 11/26/15 1238

## 2015-11-26 NOTE — ED Triage Notes (Signed)
Pt states that he has been having lower abd pain for the past few days, states that it has been intermittent, pt has some nausea and diarrhea as well, pt has a hx of aortic aneuysm

## 2015-12-13 ENCOUNTER — Ambulatory Visit (INDEPENDENT_AMBULATORY_CARE_PROVIDER_SITE_OTHER): Payer: Medicare Other | Admitting: Family Medicine

## 2015-12-13 ENCOUNTER — Encounter: Payer: Self-pay | Admitting: Family Medicine

## 2015-12-13 VITALS — BP 162/78 | HR 63 | Temp 98.0°F | Resp 16 | Ht 68.0 in | Wt 160.8 lb

## 2015-12-13 DIAGNOSIS — R1032 Left lower quadrant pain: Secondary | ICD-10-CM

## 2015-12-13 DIAGNOSIS — E785 Hyperlipidemia, unspecified: Secondary | ICD-10-CM

## 2015-12-13 DIAGNOSIS — I10 Essential (primary) hypertension: Secondary | ICD-10-CM

## 2015-12-13 MED ORDER — AMLODIPINE BESYLATE 10 MG PO TABS: 10.0000 mg | ORAL_TABLET | Freq: Every day | ORAL | 0 refills | Status: AC

## 2015-12-13 MED ORDER — LOSARTAN POTASSIUM 50 MG PO TABS: 50.0000 mg | ORAL_TABLET | Freq: Every day | ORAL | 0 refills | Status: AC

## 2015-12-13 MED ORDER — METOPROLOL TARTRATE 100 MG PO TABS: 100.0000 mg | ORAL_TABLET | Freq: Every day | ORAL | 0 refills | Status: AC

## 2015-12-13 NOTE — Progress Notes (Signed)
Name: John DankerLarry W Keisling   MRN: [id number]    DOB: 1940/11/05   Date:12/13/2015       Progress Note  Subjective  Chief Complaint  Chief Complaint  Patient presents with  . GI Problem  . Referral    GI    GI Problem  The primary symptoms include abdominal pain. Primary symptoms do not include fever, weight loss, nausea, vomiting, diarrhea or melena.  The illness does not include chills or constipation.  Hypertension  This is a chronic problem. The problem is unchanged. The problem is uncontrolled. Associated symptoms include malaise/fatigue and shortness of breath. Pertinent negatives include no blurred vision, chest pain, headaches or palpitations. Past treatments include calcium channel blockers and beta blockers. The current treatment provides moderate improvement. Hypertensive end-organ damage includes kidney disease. There is no history of CAD/MI, CVA or heart failure.    Pt. is here to request referral to Gastroenterology for persistent complaints of periumbilical pain, sharp, present for over a month, comes and goes with pain-free days in between. He has [facility] and CT scan of abdomen and pelvis revealed no acute abnormalities except for a stable known aneurysm. He has been prescribed Bentyl prescribed by Dr. Derrill KayGoodman from [facility], which helps relieve the pain.    Past Medical History:  Diagnosis Date  . AAA (abdominal aortic aneurysm) (HCC)   . Elevated PSA   . Hyperlipidemia   . Hypertension   . Renal disorder   . Stroke [facility](HCC)     Past Surgical History:  Procedure Laterality Date  . ENDARTERECTOMY Right 05/2008  . HEMORRHOID SURGERY    . VARICOSE VEIN SURGERY      Family History  Problem Relation Age of Onset  . Heart attack Mother   . Cancer Father   . Hypertension Father   . Diabetes Brother     Social History   Social History  . Marital status: Married    Spouse name: N/A  . Number of children: N/A  . Years of education: N/A    Occupational History  . Not on file.   Social History Main Topics  . Smoking status: Current Every Day Smoker    Packs/day: 1.00    Years: 60.00    Types: Cigarettes  . Smokeless tobacco: Never Used  . Alcohol use No  . Drug use: No  . Sexual activity: Not on file   Other Topics Concern  . Not on file   Social History Narrative  . No narrative on file     Current Outpatient Prescriptions:  .  acetaminophen (TYLENOL) 325 MG tablet, Take 650 mg by mouth every 6 (six) hours as needed., Disp: , Rfl:  .  amLODipine (NORVASC) 10 MG tablet, TAKE 1 TABLET (10 MG TOTAL) BY MOUTH DAILY., Disp: 90 tablet, Rfl: 0 .  aspirin 81 MG chewable tablet, Chew 81 mg by mouth daily., Disp: , Rfl:  .  aspirin-sod bicarb-citric acid (ALKA-SELTZER) 325 MG TBEF tablet, Take 325 mg by mouth every 6 (six) hours as needed. Reported on 02/24/2015, Disp: , Rfl:  .  dicyclomine (BENTYL) 20 MG tablet, Take 1 tablet (20 mg total) by mouth 3 (three) times daily as needed for spasms., Disp: 30 tablet, Rfl: 0 .  metoprolol (LOPRESSOR) 100 MG tablet, Take 1 tablet (100 mg total) by mouth daily., Disp: 90 tablet, Rfl: 0 .  oxyCODONE-acetaminophen (ROXICET) 5-325 MG tablet, Take 1 tablet by mouth every 4 (four) hours as needed for severe  pain., Disp: 20 tablet, Rfl: 0 .  pantoprazole (PROTONIX) 40 MG tablet, Take 1 tablet (40 mg total) by mouth daily., Disp: 30 tablet, Rfl: 1 .  polyethylene glycol (MIRALAX / GLYCOLAX) packet, Take 17 g by mouth daily., Disp: 14 each, Rfl: 0 .  rosuvastatin (CRESTOR) 10 MG tablet, Take 1 tablet (10 mg total) by mouth at bedtime., Disp: 90 tablet, Rfl: 0 .  traMADol (ULTRAM) 50 MG tablet, Take 1 tablet (50 mg total) by mouth every 6 (six) hours as needed., Disp: 20 tablet, Rfl: 0  Allergies  Allergen Reactions  . Contrast Media [Iodinated Diagnostic Agents]      Review of Systems  Constitutional: Positive for malaise/fatigue. Negative for chills, fever and weight loss.  Eyes:  Negative for blurred vision.  Respiratory: Positive for shortness of breath.   Cardiovascular: Negative for chest pain and palpitations.  Gastrointestinal: Positive for abdominal pain. Negative for blood in stool, constipation, diarrhea, melena, nausea and vomiting.  Neurological: Negative for headaches.     Objective  Vitals:   12/13/15 1330  BP: (!) 162/78  Pulse: 63  Resp: 16  Temp: 98 F (36.7 C)  TempSrc: Oral  SpO2: 95%  Weight: 160 lb 12.8 oz (72.9 kg)  Height: 5\' 8"  (1.727 m)    Physical Exam  Constitutional: He is oriented to person, place, and time and well-developed, well-nourished, and in no distress.  HENT:  Head: Normocephalic and atraumatic.  Cardiovascular: Normal rate, regular rhythm and normal heart sounds.   No murmur heard. Pulmonary/Chest: Effort normal and breath sounds normal. He has no wheezes.  Abdominal: Soft. Bowel sounds are normal. There is no tenderness.  Musculoskeletal: He exhibits no edema.  Neurological: He is alert and oriented to person, place, and time.  Psychiatric: Mood, memory, affect and judgment normal.  Nursing note and vitals reviewed.     Assessment & Plan  1. Benign hypertension BP is elevated uncontrolled, will add losartan 50 mg to his regimen, advised to check BP at night and review in 6-8 weeks. - metoprolol (LOPRESSOR) 100 MG tablet; Take 1 tablet (100 mg total) by mouth daily.  Dispense: 90 tablet; Refill: 0 - amLODipine (NORVASC) 10 MG tablet; Take 1 tablet (10 mg total) by mouth daily.  Dispense: 90 tablet; Refill: 0 - losartan (COZAAR) 50 MG tablet; Take 1 tablet (50 mg total) by mouth daily.  Dispense: 90 tablet; Refill: 0  2. LLQ abdominal pain CT scan was unrevealing, aneurysm is stable. We will refer to gastroenterology for possible colonoscopy - Ambulatory referral to Gastroenterology  3. Hyperlipidemia, unspecified hyperlipidemia type  - Lipid Profile   Jidenna Figgs Asad A. Faylene KurtzShah [facility] 12/13/2015 1:39 PM

## 2015-12-20 ENCOUNTER — Ambulatory Visit: Payer: Medicare Other | Admitting: Family Medicine

## 2015-12-21 ENCOUNTER — Telehealth: Payer: Self-pay | Admitting: Family Medicine

## 2015-12-21 LAB — LIPID PANEL
CHOL/HDL RATIO: 4.8 ratio (ref <5.0)
Cholesterol: 160 mg/dL (ref <200)
HDL: 33 mg/dL — AB (ref >40)
LDL CALC: 98 mg/dL (ref <100)
Triglycerides: 147 mg/dL (ref <150)
VLDL: 29 mg/dL (ref <30)

## 2015-12-21 NOTE — Telephone Encounter (Signed)
Patient reports having excruciating lower abdominal pain radiating up into his chest, he was prescribed pantoprazole by the emergency room at [facility] for reflux which is not helping at all. Patient was advised to seek immediate medical attention and call 911 to be transported to the emergency room. He verbalized understanding.

## 2015-12-21 NOTE — Telephone Encounter (Signed)
Pt is requesting a call from the Dr and not the nurses. He is having severe pain in stomach and he is waiting to get his colonscopy appt.

## 2015-12-24 ENCOUNTER — Emergency Department
Admission: EM | Admit: 2015-12-24 | Discharge: 2015-12-24 | Disposition: A | Discharge status: Home / Self Care | Payer: Medicare Other | Source: Home / Self Care | Attending: Emergency Medicine | Admitting: Emergency Medicine

## 2015-12-24 ENCOUNTER — Encounter: Payer: Self-pay | Admitting: Emergency Medicine

## 2015-12-24 ENCOUNTER — Emergency Department: Payer: Medicare Other

## 2015-12-24 DIAGNOSIS — F17210 Nicotine dependence, cigarettes, uncomplicated: Secondary | ICD-10-CM | POA: Insufficient documentation

## 2015-12-24 DIAGNOSIS — R1084 Generalized abdominal pain: Secondary | ICD-10-CM

## 2015-12-24 DIAGNOSIS — I129 Hypertensive chronic kidney disease with stage 1 through stage 4 chronic kidney disease, or unspecified chronic kidney disease: Secondary | ICD-10-CM | POA: Insufficient documentation

## 2015-12-24 DIAGNOSIS — N179 Acute kidney failure, unspecified: Secondary | ICD-10-CM | POA: Diagnosis not present

## 2015-12-24 DIAGNOSIS — Z79899 Other long term (current) drug therapy: Secondary | ICD-10-CM | POA: Insufficient documentation

## 2015-12-24 DIAGNOSIS — I714 Abdominal aortic aneurysm, without rupture, unspecified: Secondary | ICD-10-CM

## 2015-12-24 DIAGNOSIS — N183 Chronic kidney disease, stage 3 (moderate): Secondary | ICD-10-CM

## 2015-12-24 DIAGNOSIS — R112 Nausea with vomiting, unspecified: Secondary | ICD-10-CM | POA: Diagnosis not present

## 2015-12-24 MED ORDER — ONDANSETRON HCL 4 MG/2ML IJ SOLN
INTRAMUSCULAR | Status: AC
  Administered 2015-12-24: 4 mg via INTRAVENOUS
  Filled 2015-12-24: qty 2

## 2015-12-24 MED ORDER — MORPHINE SULFATE (PF) 4 MG/ML IV SOLN
INTRAVENOUS | Status: AC
  Administered 2015-12-24: 4 mg via INTRAVENOUS
  Filled 2015-12-24: qty 1

## 2015-12-24 MED ORDER — MORPHINE SULFATE (PF) 4 MG/ML IV SOLN
4.0000 mg | Freq: Once | INTRAVENOUS | Status: AC
  Administered 2015-12-24: 4 mg via INTRAVENOUS

## 2015-12-24 MED ORDER — HYDROMORPHONE HCL 1 MG/ML IJ SOLN
1.0000 mg | Freq: Once | INTRAMUSCULAR | Status: AC
  Administered 2015-12-24: 1 mg via INTRAVENOUS
  Filled 2015-12-24: qty 1

## 2015-12-24 MED ORDER — LABETALOL HCL 5 MG/ML IV SOLN
INTRAVENOUS | Status: AC
  Administered 2015-12-24: 10 mg via INTRAVENOUS
  Filled 2015-12-24: qty 4

## 2015-12-24 MED ORDER — HYDROMORPHONE HCL 2 MG PO TABS: 2.0000 mg | ORAL_TABLET | Freq: Two times a day (BID) | ORAL | 0 refills | Status: AC | PRN

## 2015-12-24 MED ORDER — LABETALOL HCL 5 MG/ML IV SOLN
10.0000 mg | Freq: Once | INTRAVENOUS | Status: AC
  Administered 2015-12-24: 10 mg via INTRAVENOUS

## 2015-12-24 MED ORDER — ONDANSETRON HCL 4 MG/2ML IJ SOLN
4.0000 mg | Freq: Once | INTRAMUSCULAR | Status: AC
  Administered 2015-12-24: 4 mg via INTRAVENOUS

## 2015-12-24 MED ORDER — PROMETHAZINE HCL 25 MG PO TABS: 25.0000 mg | ORAL_TABLET | Freq: Four times a day (QID) | ORAL | 0 refills | Status: AC | PRN

## 2015-12-24 NOTE — ED Notes (Signed)
After pt was given morphine, pt sats dropped to 88% on RA. Pt placed on 2L by [place]. Sats came up to 90-91%. O2 upped to 3L and sats up to 96-97%. MD notified of intervention.

## 2015-12-24 NOTE — ED Provider Notes (Signed)
I discussed the case with [facility]. Patient was just seen there approximately 36 hours ago. He is not felt to have any acute vascular emergency. [employer] copy[employer]as[employer] surgeon on call states he will be happy to accept him in transfer but does not feel there is any additional treatment at this time. He is supposed to see them in the office soon. I will discharge him with pain medicine and antiemetics. [facility] or worrisome symptoms. Family is agreeable to plan. Unfortunately he has refused AAA repair for some time due to the need for dialysis afterwards.   Emily FilbertJonathan E Williams, MD 12/24/15 [phone]

## 2015-12-24 NOTE — ED Provider Notes (Signed)
[facility] Emergency Department Provider Note    First MD Initiated Contact with Patient 12/24/15 [phone]     (approximate)  I have reviewed the triage vital signs and the nursing notes.   HISTORY  Chief Complaint Abdominal Pain; Hypertension; and AAA    HPI John Knight is a 75 y.o. male list of chronic medical conditions including a "7.2 cm AAA presents with [facility] of [facility] yesterday AGAINST MEDICAL ADVICE. Patient presents today with a 9 out of 10 central abdominal pain. Patient also admits to "feeling lightheaded". Patient denies any nausea no vomiting diarrhea constipation fever or any other complaints. Patient noted to be markedly hypertensive on presentation with blood pressure of 209/80.   Past Medical History:  Diagnosis Date  . AAA (abdominal aortic aneurysm) (HCC)   . AAA (abdominal aortic aneurysm) (HCC)   . Elevated PSA   . Hyperlipidemia   . Hypertension   . Renal disorder   . Stroke [facility](HCC)     Patient Active Problem List   Diagnosis Date Noted  . Acute renal failure (ARF) (HCC) 2016-01-07  . LLQ abdominal pain 12/13/2015  . Abdominal pain, acute, periumbilical 09/15/2015  . Acute bilateral low back pain without sciatica 09/15/2015  . Hyperglycemia 09/15/2015  . CKD (chronic kidney disease) stage 3, GFR 30-59 ml/min 01/28/2015  . AAA (abdominal aortic aneurysm) without rupture (HCC) 08/16/2014  . Benign hypertension 08/16/2014  . Hyperlipidemia 08/16/2014  . Non-healing skin lesion 08/16/2014    Past Surgical History:  Procedure Laterality Date  . ENDARTERECTOMY Right 05/2008  . HEMORRHOID SURGERY    . VARICOSE VEIN SURGERY      Prior to Admission medications   Medication Sig Start Date End Date Taking? Authorizing Provider  amLODipine (NORVASC) 10 MG tablet Take 1 tablet (10 mg total) by mouth daily. 12/13/15  Yes Ellyn HackSyed Asad A Shah, MD  dicyclomine (BENTYL) 20 MG tablet Take 1 tablet (20 mg total) by  mouth 3 (three) times daily as needed for spasms. Patient not taking: Reported on 01/04/2016 11/26/15 11/25/16 Yes Phineas SemenGraydon Goodman, MD  losartan (COZAAR) 50 MG tablet Take 1 tablet (50 mg total) by mouth daily. 12/13/15  Yes Ellyn HackSyed Asad A Shah, MD  metoprolol (LOPRESSOR) 100 MG tablet Take 1 tablet (100 mg total) by mouth daily. 12/13/15  Yes Ellyn HackSyed Asad A Shah, MD  pantoprazole (PROTONIX) 40 MG tablet Take 1 tablet (40 mg total) by mouth daily. Patient not taking: Reported on 01/17/2016 10/05/15 10/04/16 Yes Emily FilbertJonathan E Williams, MD  polyethylene glycol [facility](MIRALAX / Ethelene HalGLYCOLAX) packet Take 17 g by mouth daily. Patient not taking: Reported on 12/31/2015 10/05/15  Yes Emily FilbertJonathan E Williams, MD  rosuvastatin (CRESTOR) 10 MG tablet Take 1 tablet (10 mg total) by mouth at bedtime. 09/23/15  Yes Ellyn HackSyed Asad A Shah, MD  traMADol (ULTRAM) 50 MG tablet Take 1 tablet (50 mg total) by mouth every 6 (six) hours as needed. Patient not taking: Reported on 01/06/2016 10/05/15 10/04/16 Yes Emily FilbertJonathan E Williams, MD  aspirin EC 81 MG tablet Take 81 mg by mouth daily.    Historical Provider, MD  HYDROmorphone (DILAUDID) 2 MG tablet Take 1 tablet (2 mg total) by mouth every 12 (twelve) hours as needed for severe pain. 12/24/15 12/23/16  Emily FilbertJonathan E Williams, MD  oxyCODONE-acetaminophen (ROXICET) 5-325 MG tablet Take 1 tablet by mouth every 4 (four) hours as needed for severe pain. Patient not taking: Reported on 12/29/2015 01/11/15   Darci Currentandolph N Ryelan Kazee, MD  promethazine (PHENERGAN) 25 MG tablet Take 1 tablet (25 mg total) by mouth every 6 (six) hours as needed for nausea or vomiting. 12/24/15   Emily FilbertJonathan E Williams, MD    Allergies Contrast media [iodinated diagnostic agents]  Family History  Problem Relation Age of Onset  . Heart attack Mother   . Cancer Father   . Hypertension Father   . Diabetes Brother     Social History Social History  Substance Use Topics  . Smoking status: Current Every Day Smoker    Packs/day: 1.00    Years:  60.00    Types: Cigarettes  . Smokeless tobacco: Never Used  . Alcohol use No    Review of Systems Constitutional: No fever/chills Eyes: No visual changes. ENT: No sore throat. Cardiovascular: Denies chest pain. Respiratory: Denies shortness of breath. Gastrointestinal:Positive for abdominal pain.  No nausea, no vomiting.  No diarrhea.  No constipation. Genitourinary: Negative for dysuria. Musculoskeletal: Negative for back pain. Skin: Negative for rash. Neurological: Negative for headaches, focal weakness or numbness.  10-point ROS otherwise negative.  ____________________________________________   PHYSICAL EXAM:  VITAL SIGNS: ED Triage Vitals  Enc Vitals Group     BP 12/24/15 0626 (!) 209/80     Pulse Rate 12/24/15 0626 69     Resp 12/24/15 0626 19     Temp 12/24/15 0626 98.4 F (36.9 C)     Temp Source 12/24/15 0626 Oral     SpO2 12/24/15 0626 94 %     Weight 12/24/15 0627 162 lb (73.5 kg)     Height 12/24/15 0627 5\' 8"  (1.727 m)     Head Circumference --      Peak Flow --      Pain Score 12/24/15 0627 10     Pain Loc --      Pain Edu? --      Excl. in GC? --     Constitutional: Alert and oriented. Well appearing and in no acute distress. Eyes: Conjunctivae are normal. PERRL. EOMI. Head: Atraumatic. Mouth/Throat: Mucous membranes are moist.  Oropharynx non-erythematous. Neck: No stridor.   Cardiovascular: Normal rate, regular rhythm. Good peripheral circulation. Grossly normal heart sounds. Respiratory: Normal respiratory effort.  No retractions. Lungs CTAB. Gastrointestinal: Palpable pulsatile left lower quadrant mass. No distention.  Musculoskeletal: No lower extremity tenderness nor edema. No gross deformities of extremities. Neurologic:  Normal speech and language. No gross focal neurologic deficits are appreciated.  Skin:  Skin is warm, dry and intact. No rash noted. Psychiatric: Mood and affect are normal. Speech and behavior are  normal.  ____________________________________________   LABS (all labs ordered are listed, but only abnormal results are displayed)  Labs Reviewed - No data to display    RADIOLOGY I, Drift[place] N Yaresly Menzel, personally viewed and evaluated these images (plain radiographs) as part of my medical decision making, as well as reviewing the written report by the radiologist.   CLINICAL DATA:  Abdominal pain. Technologist notes state: [facility] after admission for known abdominal aortic aneurysm.  EXAM: CT ABDOMEN AND PELVIS WITHOUT CONTRAST  TECHNIQUE: Multidetector CT imaging of the abdomen and pelvis was performed following the standard protocol without IV contrast.  COMPARISON:  11/26/2015 CT abdomen and pelvis, [facility].  FINDINGS: Lower chest: No acute abnormality.  Hepatobiliary: No focal liver abnormality is seen. No gallstones, gallbladder wall thickening, or biliary dilatation.  Pancreas: Unremarkable. No pancreatic ductal dilatation or surrounding inflammatory changes.  Spleen: Normal in size without focal  abnormality.  Adrenals/Urinary Tract: Adrenal glands are unremarkable. Kidneys are normal, without renal calculi, focal lesion, or hydronephrosis. Bladder is unremarkable.  Stomach/Bowel: Stomach is within normal limits. Appendix appears normal. No evidence of bowel wall thickening, distention, or inflammatory changes.  Vascular/Lymphatic: Redemonstrated is a infrarenal abdominal aortic aneurysm unchanged from the prior most recent CT, cross-section of 7.0 x 7.3 cm. No evidence for perianeurysmal hemorrhage. No inflammatory changes.  Reproductive: Prostate is prominent with central calcification.  Other: No abdominal wall hernia or abnormality. Small BILATERAL fat containing inguinal hernias. No abdominopelvic ascites.  Musculoskeletal: No acute or significant osseous  findings.  IMPRESSION: Stable greater than 7 cm abdominal aortic aneurysm. No evidence for acute perianeurysmal hemorrhage.   Electronically Signed   By: Elsie Stain M.D.   On: 12/24/2015 07:50 Procedures     INITIAL IMPRESSION / ASSESSMENT AND PLAN / ED COURSE  Pertinent labs & imaging results that were available during my care of the patient were reviewed by me and considered in my medical decision making (see chart for details).  Patient given labetalol 20 mg on arrival to the emergency department. Concern for possible dissection of known greater than 7 cm abdominal aortic aneurysm. CT scan obtained immediately after arrival to the emergency department. Patient's care transferred to Dr. Mayford Knife.   Clinical Course     ____________________________________________  FINAL CLINICAL IMPRESSION(S) / ED DIAGNOSES  Final diagnoses:  Generalized abdominal pain  Abdominal aortic aneurysm (AAA) without rupture (HCC)     MEDICATIONS GIVEN DURING THIS VISIT:  Medications  morphine 4 MG/ML injection 4 mg (4 mg Intravenous Given 12/24/15 0640)  ondansetron (ZOFRAN) injection 4 mg (4 mg Intravenous Given 12/24/15 0641)  labetalol (NORMODYNE,TRANDATE) injection 10 mg (10 mg Intravenous Given 12/24/15 0641)  HYDROmorphone (DILAUDID) injection 1 mg (1 mg Intravenous Given 12/24/15 0904)     NEW OUTPATIENT MEDICATIONS STARTED DURING THIS VISIT:  Discharge Medication List as of 12/24/2015  9:09 AM    START taking these medications   Details  HYDROmorphone (DILAUDID) 2 MG tablet Take 1 tablet (2 mg total) by mouth every 12 (twelve) hours as needed for severe pain., Starting Sat 12/24/2015, Until Sun 12/23/2016, Print    promethazine (PHENERGAN) 25 MG tablet Take 1 tablet (25 mg total) by mouth every 6 (six) hours as needed for nausea or vomiting., Starting Sat 12/24/2015, Print        Discharge Medication List as of 12/24/2015  9:09 AM      Discharge Medication List as of  12/24/2015  9:09 AM       Note:  This document was prepared using Dragon voice recognition software and may include unintentional dictation errors.    Darci Current, MD 01/16/2016 [phone]

## 2015-12-24 NOTE — ED Triage Notes (Signed)
Pt arrived via EMS from home with complaints of abd pain. Pt has known 7.2cm AAA. [facility] being [facility] for about a day and a half. [facility] told pt that he needed surgery for his AAA but that if he had surgery he would need dialysis afterwards due to his kidney function. Pt is refusing surgery because he does not want to be on dialysis. Pt called EMS tonight because his abd became worse and he felt like he was going to pass out.

## 2015-12-26 ENCOUNTER — Emergency Department: Payer: Medicare Other

## 2015-12-26 ENCOUNTER — Inpatient Hospital Stay
Admission: EM | Admit: 2015-12-26 | Discharge: 2016-01-23 | DRG: 682 | Disposition: E | Payer: Medicare Other | Attending: Internal Medicine | Admitting: Internal Medicine

## 2015-12-26 DIAGNOSIS — E43 Unspecified severe protein-calorie malnutrition: Secondary | ICD-10-CM | POA: Diagnosis present

## 2015-12-26 DIAGNOSIS — I959 Hypotension, unspecified: Secondary | ICD-10-CM | POA: Diagnosis not present

## 2015-12-26 DIAGNOSIS — F17210 Nicotine dependence, cigarettes, uncomplicated: Secondary | ICD-10-CM | POA: Diagnosis present

## 2015-12-26 DIAGNOSIS — Z8249 Family history of ischemic heart disease and other diseases of the circulatory system: Secondary | ICD-10-CM

## 2015-12-26 DIAGNOSIS — E871 Hypo-osmolality and hyponatremia: Secondary | ICD-10-CM | POA: Diagnosis present

## 2015-12-26 DIAGNOSIS — E860 Dehydration: Secondary | ICD-10-CM | POA: Diagnosis present

## 2015-12-26 DIAGNOSIS — I713 Abdominal aortic aneurysm, ruptured: Secondary | ICD-10-CM | POA: Diagnosis not present

## 2015-12-26 DIAGNOSIS — Z6823 Body mass index (BMI) 23.0-23.9, adult: Secondary | ICD-10-CM | POA: Diagnosis not present

## 2015-12-26 DIAGNOSIS — Z7982 Long term (current) use of aspirin: Secondary | ICD-10-CM | POA: Diagnosis not present

## 2015-12-26 DIAGNOSIS — R972 Elevated prostate specific antigen [PSA]: Secondary | ICD-10-CM | POA: Diagnosis present

## 2015-12-26 DIAGNOSIS — D72829 Elevated white blood cell count, unspecified: Secondary | ICD-10-CM

## 2015-12-26 DIAGNOSIS — E785 Hyperlipidemia, unspecified: Secondary | ICD-10-CM | POA: Diagnosis present

## 2015-12-26 DIAGNOSIS — N183 Chronic kidney disease, stage 3 (moderate): Secondary | ICD-10-CM | POA: Diagnosis present

## 2015-12-26 DIAGNOSIS — N179 Acute kidney failure, unspecified: Secondary | ICD-10-CM | POA: Diagnosis present

## 2015-12-26 DIAGNOSIS — J9600 Acute respiratory failure, unspecified whether with hypoxia or hypercapnia: Secondary | ICD-10-CM | POA: Diagnosis not present

## 2015-12-26 DIAGNOSIS — Z833 Family history of diabetes mellitus: Secondary | ICD-10-CM | POA: Diagnosis not present

## 2015-12-26 DIAGNOSIS — E876 Hypokalemia: Secondary | ICD-10-CM | POA: Diagnosis present

## 2015-12-26 DIAGNOSIS — R197 Diarrhea, unspecified: Secondary | ICD-10-CM

## 2015-12-26 DIAGNOSIS — Z809 Family history of malignant neoplasm, unspecified: Secondary | ICD-10-CM

## 2015-12-26 DIAGNOSIS — R112 Nausea with vomiting, unspecified: Secondary | ICD-10-CM

## 2015-12-26 DIAGNOSIS — Z8673 Personal history of transient ischemic attack (TIA), and cerebral infarction without residual deficits: Secondary | ICD-10-CM

## 2015-12-26 DIAGNOSIS — I469 Cardiac arrest, cause unspecified: Secondary | ICD-10-CM | POA: Diagnosis not present

## 2015-12-26 DIAGNOSIS — I129 Hypertensive chronic kidney disease with stage 1 through stage 4 chronic kidney disease, or unspecified chronic kidney disease: Secondary | ICD-10-CM | POA: Diagnosis present

## 2015-12-26 DIAGNOSIS — K920 Hematemesis: Secondary | ICD-10-CM | POA: Diagnosis not present

## 2015-12-26 DIAGNOSIS — Z91041 Radiographic dye allergy status: Secondary | ICD-10-CM

## 2015-12-26 LAB — ABO/RH: ABO/RH(D): B POS

## 2015-12-26 LAB — GASTROINTESTINAL PANEL BY PCR, STOOL (REPLACES STOOL CULTURE)

## 2015-12-26 LAB — COMPREHENSIVE METABOLIC PANEL
ALBUMIN: 3.4 g/dL — AB (ref 3.5–5.0)
ALK PHOS: 100 U/L (ref 38–126)
ALT: 8 U/L — ABNORMAL LOW (ref 17–63)
ANION GAP: 12 (ref 5–15)
AST: 16 U/L (ref 15–41)
BILIRUBIN TOTAL: 1.2 mg/dL (ref 0.3–1.2)
BUN: 42 mg/dL — ABNORMAL HIGH (ref 6–20)
CALCIUM: 10.2 mg/dL (ref 8.9–10.3)
CO2: 27 mmol/L (ref 22–32)
Chloride: 92 mmol/L — ABNORMAL LOW (ref 101–111)
Creatinine, Ser: 2.58 mg/dL — ABNORMAL HIGH (ref 0.61–1.24)
GFR calc Af Amer: 26 mL/min — ABNORMAL LOW (ref >60)
GFR, EST NON AFRICAN AMERICAN: 23 mL/min — AB (ref >60)
GLUCOSE: 124 mg/dL — AB (ref 65–99)
Potassium: 3.4 mmol/L — ABNORMAL LOW (ref 3.5–5.1)
Sodium: 131 mmol/L — ABNORMAL LOW (ref 135–145)
TOTAL PROTEIN: 7.7 g/dL (ref 6.5–8.1)

## 2015-12-26 LAB — CBC
HCT: 44.1 % (ref 40.0–52.0)
HEMOGLOBIN: 15.1 g/dL (ref 13.0–18.0)
MCH: 31.3 pg (ref 26.0–34.0)
MCHC: 34.3 g/dL (ref 32.0–36.0)
MCV: 91.2 fL (ref 80.0–100.0)
Platelets: 203 10*3/uL (ref 150–440)
RBC: 4.84 MIL/uL (ref 4.40–5.90)
RDW: 13.9 % (ref 11.5–14.5)
WBC: 12.7 10*3/uL — AB (ref 3.8–10.6)

## 2015-12-26 LAB — LIPASE, BLOOD: Lipase: 17 U/L (ref 11–51)

## 2015-12-26 LAB — TYPE AND SCREEN
ABO/RH(D): B POS
ANTIBODY SCREEN: NEGATIVE

## 2015-12-26 LAB — HEMOGLOBIN: Hemoglobin: 14.3 g/dL (ref 13.0–18.0)

## 2015-12-26 MED ORDER — SODIUM CHLORIDE 0.9 % IV BOLUS (SEPSIS)
1000.0000 mL | Freq: Once | INTRAVENOUS | Status: AC
  Administered 2015-12-26: 1000 mL via INTRAVENOUS

## 2015-12-26 MED ORDER — PANTOPRAZOLE SODIUM 40 MG PO TBEC
40.0000 mg | DELAYED_RELEASE_TABLET | Freq: Every day | ORAL | Status: DC
  Administered 2015-12-26 – 2015-12-28 (×3): 40 mg via ORAL
  Filled 2015-12-26 (×3): qty 1

## 2015-12-26 MED ORDER — SODIUM CHLORIDE 0.9% FLUSH
3.0000 mL | Freq: Two times a day (BID) | INTRAVENOUS | Status: DC
  Administered 2015-12-27 – 2015-12-28 (×2): 3 mL via INTRAVENOUS

## 2015-12-26 MED ORDER — ONDANSETRON HCL 4 MG/2ML IJ SOLN
4.0000 mg | Freq: Once | INTRAMUSCULAR | Status: AC | PRN
  Administered 2015-12-26: 4 mg via INTRAVENOUS
  Filled 2015-12-26: qty 2

## 2015-12-26 MED ORDER — POLYETHYLENE GLYCOL 3350 17 G PO PACK: 17.0000 g | PACK | Freq: Every day | ORAL | Status: DC

## 2015-12-26 MED ORDER — PROMETHAZINE HCL 25 MG PO TABS
25.0000 mg | ORAL_TABLET | Freq: Four times a day (QID) | ORAL | Status: DC | PRN
  Administered 2015-12-27 – 2015-12-28 (×2): 25 mg via ORAL
  Filled 2015-12-26 (×2): qty 1

## 2015-12-26 MED ORDER — LOSARTAN POTASSIUM 50 MG PO TABS
50.0000 mg | ORAL_TABLET | Freq: Every day | ORAL | Status: DC
  Administered 2015-12-27: 12:00:00 50 mg via ORAL
  Filled 2015-12-26: qty 1

## 2015-12-26 MED ORDER — ROSUVASTATIN CALCIUM 10 MG PO TABS
10.0000 mg | ORAL_TABLET | Freq: Every day | ORAL | Status: DC
  Administered 2015-12-26 – 2015-12-27 (×2): 10 mg via ORAL
  Filled 2015-12-26 (×2): qty 1

## 2015-12-26 MED ORDER — DIPHENHYDRAMINE HCL 50 MG/ML IJ SOLN: 50.0000 mg | Freq: Once | INTRAMUSCULAR | Status: DC

## 2015-12-26 MED ORDER — OXYCODONE-ACETAMINOPHEN 5-325 MG PO TABS
1.0000 | ORAL_TABLET | ORAL | Status: DC | PRN
  Administered 2015-12-27 – 2015-12-28 (×7): 1 via ORAL
  Filled 2015-12-26 (×7): qty 1

## 2015-12-26 MED ORDER — METOPROLOL TARTRATE 50 MG PO TABS
100.0000 mg | ORAL_TABLET | Freq: Every day | ORAL | Status: DC
  Administered 2015-12-27 – 2015-12-28 (×2): 100 mg via ORAL
  Filled 2015-12-26 (×3): qty 2

## 2015-12-26 MED ORDER — TRAMADOL HCL 50 MG PO TABS
50.0000 mg | ORAL_TABLET | Freq: Four times a day (QID) | ORAL | Status: DC | PRN
  Administered 2015-12-27: 02:00:00 50 mg via ORAL
  Filled 2015-12-26: qty 1

## 2015-12-26 MED ORDER — AMLODIPINE BESYLATE 5 MG PO TABS
10.0000 mg | ORAL_TABLET | Freq: Every day | ORAL | Status: DC
  Administered 2015-12-27 – 2015-12-28 (×2): 10 mg via ORAL
  Filled 2015-12-26 (×2): qty 2

## 2015-12-26 MED ORDER — HYDROCORTISONE NA SUCCINATE PF 100 MG IJ SOLR: 200.0000 mg | Freq: Once | INTRAMUSCULAR | Status: DC

## 2015-12-26 MED ORDER — HYDROMORPHONE HCL 1 MG/ML IJ SOLN
1.0000 mg | Freq: Once | INTRAMUSCULAR | Status: AC
  Administered 2015-12-26: 1 mg via INTRAVENOUS
  Filled 2015-12-26: qty 1

## 2015-12-26 MED ORDER — SODIUM CHLORIDE 0.9 % IV SOLN
INTRAVENOUS | Status: DC
  Administered 2015-12-26 – 2015-12-28 (×4): via INTRAVENOUS

## 2015-12-26 MED ORDER — HYDROMORPHONE HCL 1 MG/ML IJ SOLN
1.0000 mg | INTRAMUSCULAR | Status: DC | PRN
  Administered 2015-12-27 – 2015-12-28 (×5): 1 mg via INTRAVENOUS
  Filled 2015-12-26 (×5): qty 1

## 2015-12-26 MED ORDER — ONDANSETRON HCL 4 MG/2ML IJ SOLN
4.0000 mg | Freq: Once | INTRAMUSCULAR | Status: AC
  Administered 2015-12-26: 4 mg via INTRAVENOUS
  Filled 2015-12-26: qty 2

## 2015-12-26 NOTE — ED Provider Notes (Signed)
[facility] Emergency Department Provider Note        Time seen: ----------------------------------------- 2:11 PM on 2015-03-28 -----------------------------------------    I have reviewed the triage vital signs and the nursing notes.   HISTORY  Chief Complaint Abdominal Pain    HPI John Knight is a 75 y.o. male who presents to ER for abdominal discomfort with nausea, vomiting and watery stools since last Wednesday. Patient states [facility]. He was seen here, case was discussed with vascular surgery and it was felt to be safe for him to be discharged. Patient is had poor by mouth intake, patient states the pain medicine and antiemetics he was prescribed haven't helped his symptoms. His chief complaint right now is diarrhea. He did have one episode of vomiting today.   Past Medical History:  Diagnosis Date  . AAA (abdominal aortic aneurysm) (HCC)   . Elevated PSA   . Hyperlipidemia   . Hypertension   . Renal disorder   . Stroke [facility](HCC)     Patient Active Problem List   Diagnosis Date Noted  . LLQ abdominal pain 12/13/2015  . Abdominal pain, acute, periumbilical 09/15/2015  . Acute bilateral low back pain without sciatica 09/15/2015  . Hyperglycemia 09/15/2015  . CKD (chronic kidney disease) stage 3, GFR 30-59 ml/min 01/28/2015  . AAA (abdominal aortic aneurysm) without rupture (HCC) 08/16/2014  . Benign hypertension 08/16/2014  . Hyperlipidemia 08/16/2014  . Non-healing skin lesion 08/16/2014    Past Surgical History:  Procedure Laterality Date  . ENDARTERECTOMY Right 05/2008  . HEMORRHOID SURGERY    . VARICOSE VEIN SURGERY      Allergies Contrast media [iodinated diagnostic agents]  Social History Social History  Substance Use Topics  . Smoking status: Current Every Day Smoker    Packs/day: 1.00    Years: 60.00    Types: Cigarettes  . Smokeless tobacco: Never Used  . Alcohol use No     Review of Systems Constitutional: Negative for fever. Cardiovascular: Negative for chest pain. Respiratory: Negative for shortness of breath. Gastrointestinal: Positive for abdominal pain, vomiting and diarrhea Genitourinary: Negative for dysuria. Musculoskeletal: Negative for back pain. Skin: Negative for rash. Neurological: Negative for headaches, positive for weakness  10-point ROS otherwise negative.  ____________________________________________   PHYSICAL EXAM:  VITAL SIGNS: ED Triage Vitals [Apr 22, 2015 1206]  Enc Vitals Group     BP 137/66     Pulse Rate 100     Resp 18     Temp 97.9 F (36.6 C)     Temp Source Oral     SpO2 94 %     Weight 162 lb (73.5 kg)     Height 5\' 8"  (1.727 m)     Head Circumference      Peak Flow      Pain Score 6     Pain Loc      Pain Edu?      Excl. in GC?     Constitutional: Alert and oriented. Well appearing and in no distress. Eyes: Conjunctivae are normal. PERRL. Normal extraocular movements. ENT   Head: Normocephalic and atraumatic.   Nose: No congestion/rhinnorhea.   Mouth/Throat: Mucous membranes are moist.   Neck: No stridor. Cardiovascular: Normal rate, regular rhythm. No murmurs, rubs, or gallops. Respiratory: Normal respiratory effort without tachypnea nor retractions. Breath sounds are clear and equal bilaterally. No wheezes/rales/rhonchi. Gastrointestinal: Soft and nontender. Normal bowel sounds, Pulsatile aorta palpable Musculoskeletal: Nontender with normal range  of motion in all extremities. No lower extremity tenderness nor edema. Neurologic:  Normal speech and language. No gross focal neurologic deficits are appreciated.  Skin:  Skin is warm, dry and intact. No rash noted. Psychiatric: Mood and affect are normal. Speech and behavior are normal.  ____________________________________________  EKG: Interpreted by me. Sinus rhythm rate 82 bpm, normal PR interval, normal QRS width, normal QT, left axis  deviation, incomplete right bundle branch block  ____________________________________________  ED COURSE:  Pertinent labs & imaging results that were available during my care of the patient were reviewed by me and considered in my medical decision making (see chart for details). Clinical Course   Patient presents to ER with symptoms of gastroenteritis and poor by mouth intake. We will assess with labs and possible imaging.  Procedures ____________________________________________   LABS (pertinent positives/negatives)  Labs Reviewed  COMPREHENSIVE METABOLIC PANEL - Abnormal; Notable for the following:       Result Value   Sodium 131 (*)    Potassium 3.4 (*)    Chloride 92 (*)    Glucose, Bld 124 (*)    BUN 42 (*)    Creatinine, Ser 2.58 (*)    Albumin 3.4 (*)    ALT 8 (*)    GFR calc non Af Amer 23 (*)    GFR calc Af Amer 26 (*)    All other components within normal limits  CBC - Abnormal; Notable for the following:    WBC 12.7 (*)    All other components within normal limits  LIPASE, BLOOD  URINALYSIS COMPLETEWITH MICROSCOPIC ([facility] ONLY)    RADIOLOGY  Abdominal ultrasound Is pending at this time ____________________________________________  FINAL ASSESSMENT AND PLAN  Abdominal pain, vomiting and diarrhea  Plan: Patient with labs and imaging as dictated above. No clear etiology for the patient's persistent vomiting and diarrhea. He does appear dehydrated, we have started him on saline and he will need to continue saline on an inpatient treatment course.   [name], [name], MD   Note: This dictation was prepared with Dragon dictation. Any transcriptional errors that result from this process are unintentional    Emily FilbertJonathan E Williams, MD Dec 16, 2015 1505

## 2015-12-26 NOTE — H&P (Signed)
[employer] [facility] - [place] at [facility]   PATIENT NAME: John Knight    MR#:  [id number]  DATE OF BIRTH:  [birth year]  DATE OF ADMISSION:  12/31/2015  PRIMARY CARE PHYSICIAN: Brayton ElSyed Shah, MD   REQUESTING/REFERRING PHYSICIAN: Mayford KnifeWilliams  CHIEF COMPLAINT:   Chief Complaint  Patient presents with  . Abdominal Pain    HISTORY OF PRESENT ILLNESS: John MalletLarry Knight  is a 75 y.o. male with a known history of Abdominal aortic aneurysm, elevated PSA, hyperlipidemia, hypertension, chronic renal disorder, stroke- has 7 cm abdominal aortic aneurysm for last few weeks. He was in [facility] emergency room a few days ago because of abdominal pain and diarrhea but decided to sign out AGAINST MEDICAL ADVICE. In the past he has refused aneurysm surgery because of fear of ending up on hemodialysis. 2 days ago after signing against [facility] he came to ER in our hospital, ER physician [facility] and repeated a CT scan which showed stable aneurysm so he suggested to give pain medication and discharged home with advice to follow up with him in the office. Patient's pain continued and he continued to have diarrhea. He is also not able to eat much so finally came to Hospital again today. His aneurysm is still stable. But he is noted to be in dehydration and acute renal failure this time. As [facility] vascular surgeon already suggested outpatient follow-up because of his stable aneurysm in the past ER physician advised to admit to hospitalist team for dehydration and acute renal failure.  PAST MEDICAL HISTORY:   Past Medical History:  Diagnosis Date  . AAA (abdominal aortic aneurysm) (HCC)   . AAA (abdominal aortic aneurysm) (HCC)   . Elevated PSA   . Hyperlipidemia   . Hypertension   . Renal disorder   . Stroke [facility](HCC)     PAST SURGICAL HISTORY: Past Surgical History:  Procedure Laterality Date  . ENDARTERECTOMY Right 05/2008  . HEMORRHOID SURGERY    . VARICOSE VEIN SURGERY       SOCIAL HISTORY:  Social History  Substance Use Topics  . Smoking status: Current Every Day Smoker    Packs/day: 1.00    Years: 60.00    Types: Cigarettes  . Smokeless tobacco: Never Used  . Alcohol use No    FAMILY HISTORY:  Family History  Problem Relation Age of Onset  . Heart attack Mother   . Cancer Father   . Hypertension Father   . Diabetes Brother     DRUG ALLERGIES:  Allergies  Allergen Reactions  . Contrast Media [Iodinated Diagnostic Agents]     REVIEW OF SYSTEMS:   CONSTITUTIONAL: No fever, fatigue or weakness.  EYES: No blurred or double vision.  EARS, NOSE, AND THROAT: No tinnitus or ear pain.  RESPIRATORY: No cough, shortness of breath, wheezing or hemoptysis.  CARDIOVASCULAR: No chest pain, orthopnea, edema.  GASTROINTESTINAL: positive for nausea, vomiting, diarrhea and abdominal pain.  GENITOURINARY: No dysuria, hematuria.  ENDOCRINE: No polyuria, nocturia,  HEMATOLOGY: No anemia, easy bruising or bleeding SKIN: No rash or lesion. MUSCULOSKELETAL: No joint pain or arthritis.   NEUROLOGIC: No tingling, numbness, weakness.  PSYCHIATRY: No anxiety or depression.   MEDICATIONS AT HOME:  Prior to Admission medications   Medication Sig Start Date End Date Taking? Authorizing Provider  amLODipine (NORVASC) 10 MG tablet Take 1 tablet (10 mg total) by mouth daily. 12/13/15  Yes Ellyn HackSyed Asad A Shah, MD  aspirin EC 81 MG tablet Take  81 mg by mouth daily.   Yes Historical Provider, MD  HYDROmorphone (DILAUDID) 2 MG tablet Take 1 tablet (2 mg total) by mouth every 12 (twelve) hours as needed for severe pain. 12/24/15 12/23/16 Yes Emily Filbert, MD  losartan (COZAAR) 50 MG tablet Take 1 tablet (50 mg total) by mouth daily. 12/13/15  Yes Ellyn Hack, MD  metoprolol (LOPRESSOR) 100 MG tablet Take 1 tablet (100 mg total) by mouth daily. 12/13/15  Yes Ellyn Hack, MD  promethazine (PHENERGAN) 25 MG tablet Take 1 tablet (25 mg total) by mouth every 6  (six) hours as needed for nausea or vomiting. 12/24/15  Yes Emily Filbert, MD  rosuvastatin (CRESTOR) 10 MG tablet Take 1 tablet (10 mg total) by mouth at bedtime. 09/23/15  Yes Ellyn Hack, MD  dicyclomine (BENTYL) 20 MG tablet Take 1 tablet (20 mg total) by mouth 3 (three) times daily as needed for spasms. Patient not taking: Reported on 12/27/2015 11/26/15 11/25/16  Phineas Semen, MD  oxyCODONE-acetaminophen (ROXICET) 5-325 MG tablet Take 1 tablet by mouth every 4 (four) hours as needed for severe pain. Patient not taking: Reported on 01/10/2016 01/11/15   Darci Current, MD  pantoprazole (PROTONIX) 40 MG tablet Take 1 tablet (40 mg total) by mouth daily. Patient not taking: Reported on 01/06/2016 10/05/15 10/04/16  Emily Filbert, MD  polyethylene glycol [facility] Ethelene Hal) packet Take 17 g by mouth daily. Patient not taking: Reported on 12/23/2015 10/05/15   Emily Filbert, MD  traMADol (ULTRAM) 50 MG tablet Take 1 tablet (50 mg total) by mouth every 6 (six) hours as needed. Patient not taking: Reported on 01/19/2016 10/05/15 10/04/16  Emily Filbert, MD      PHYSICAL EXAMINATION:   VITAL SIGNS: Blood pressure (!) 168/68, pulse 77, temperature 97.9 F (36.6 C), temperature source Oral, resp. rate 14, height 5\' 8"  (1.727 m), weight 73.5 kg (162 lb), SpO2 94 %.  GENERAL:  75 y.o.-year-old patient lying in the bed with no acute distress.  EYES: Pupils equal, round, reactive to light and accommodation. No scleral icterus. Extraocular muscles intact.  HEENT: Head atraumatic, normocephalic. Oropharynx and nasopharynx clear.  NECK:  Supple, no jugular venous distention. No thyroid enlargement, no tenderness.  LUNGS: Normal breath sounds bilaterally, no wheezing, rales,rhonchi or crepitation. No use of accessory muscles of respiration.  CARDIOVASCULAR: S1, S2 normal. No murmurs, rubs, or gallops.  ABDOMEN: Soft, mild tender, nondistended. Bowel sounds present. No organomegaly  or mass.  EXTREMITIES: No pedal edema, cyanosis, or clubbing.  NEUROLOGIC: Cranial nerves II through XII are intact. Muscle strength 5/5 in all extremities. Sensation intact. Gait not checked.  PSYCHIATRIC: The patient is alert and oriented x 3.  SKIN: No obvious rash, lesion, or ulcer.   LABORATORY PANEL:   CBC  Recent Labs Lab 01/20/2016 1209  WBC 12.7*  HGB 15.1  HCT 44.1  PLT 203  MCV 91.2  MCH 31.3  MCHC 34.3  RDW 13.9   ------------------------------------------------------------------------------------------------------------------  Chemistries   Recent Labs Lab 01/20/2016 1209  NA 131*  K 3.4*  CL 92*  CO2 27  GLUCOSE 124*  BUN 42*  CREATININE 2.58*  CALCIUM 10.2  AST 16  ALT 8*  ALKPHOS 100  BILITOT 1.2   ------------------------------------------------------------------------------------------------------------------ estimated creatinine clearance is 23.6 mL/min (by C-G formula based on SCr of 2.58 mg/dL (H)). ------------------------------------------------------------------------------------------------------------------ No results for input(s): TSH, T4TOTAL, T3FREE, THYROIDAB in the last 72 hours.  Invalid input(s): FREET3  Coagulation profile No results for input(s): INR, PROTIME in the last 168 hours. ------------------------------------------------------------------------------------------------------------------- No results for input(s): DDIMER in the last 72 hours. -------------------------------------------------------------------------------------------------------------------  Cardiac Enzymes No results for input(s): CKMB, TROPONINI, MYOGLOBIN in the last 168 hours.  Invalid input(s): CK ------------------------------------------------------------------------------------------------------------------ Invalid input(s):  POCBNP  ---------------------------------------------------------------------------------------------------------------  Urinalysis    Component Value Date/Time   COLORURINE YELLOW 09/15/2015 1055   APPEARANCEUR CLEAR 09/15/2015 1055   APPEARANCEUR Hazy 02/15/2013 1233   LABSPEC 1.019 09/15/2015 1055   LABSPEC 1.019 02/15/2013 1233   PHURINE 6.0 09/15/2015 1055   GLUCOSEU NEGATIVE 09/15/2015 1055   GLUCOSEU 50 mg/dL 78/29/562101/25/2015 30861233   HGBUR NEGATIVE 09/15/2015 1055   BILIRUBINUR NEGATIVE 09/15/2015 1055   BILIRUBINUR Negative 02/15/2013 1233   KETONESUR NEGATIVE 09/15/2015 1055   PROTEINUR 2+ (A) 09/15/2015 1055   NITRITE NEGATIVE 09/15/2015 1055   LEUKOCYTESUR NEGATIVE 09/15/2015 1055   LEUKOCYTESUR Negative 02/15/2013 1233     RADIOLOGY: [place] Aorta  Result Date: 01/12/2016 CLINICAL DATA:  Abdominal aortic aneurysm.  Abdominal pain. EXAM: ULTRASOUND AORTA TECHNIQUE: Ultrasound examination of the abdominal aorta was performed to evaluate for abdominal aortic aneurysm. The common iliac arteries, IVC, and kidneys were also evaluated. COMPARISON:  CT abdomen and pelvis 12/24/2015 FINDINGS: Abdominal Aorta Infrarenal abdominal aortic aneurysm with similar measurements compared to the recent CT. Large amount of thrombus within the aneurysm. Doppler imaging suggests an approximately 2 cm wide residual patent lumen. Maximum AP Diameter:  7.0 Maximum TRV Diameter: 7.1 Right Common Iliac Artery No aneurysm identified. Left Common Iliac Artery No aneurysm identified. IVC No abnormality visualized. Right Kidney Length: 9.5 Echogenicity within normal limits. No mass or hydronephrosis visualized. Left Kidney Length: 9.7 Echogenicity within normal limits. No mass or hydronephrosis visualized. IMPRESSION: 7.1 cm abdominal aortic aneurysm. Electronically Signed   By: Sebastian AcheAllen  Grady M.D.   On: Dec 17, 2015 15:24    EKG: Orders placed or performed during the hospital encounter of 07-Jun-2015  . EKG 12-Lead  .  EKG 12-Lead  . EKG 12-Lead  . EKG 12-Lead    IMPRESSION AND PLAN:  * Ac on ch renal failure   IV NS, monitor.   Avoid nephro toxics.  * AAA   Stable.   Monitor.   Explained to patient about the seriousness of the situation he and his wife understands that.   I will keep 1 unit of PRBC type and screen already in case of emergency for needed.   ER physician spoke to [facility] 2 days ago and he said he has his aneurysm is stable suggested just to have the pain control for his abdominal pain and follow-up in the office.  * Hypertension   Continue medications and keep blood pressure under control.  * hyperlipidemia   Cont statin.   All the records are reviewed and case discussed with ED provider. Management plans discussed with the patient, family and they are in agreement.  CODE STATUS: Full code Code Status History    This patient does not have a recorded code status. Please follow your organizational policy for patients in this situation.    Advance Directive Documentation   Flowsheet Row Most Recent Value  Type of Advance Directive  Healthcare Power of Attorney  Pre-existing out of facility DNR order (yellow form or pink MOST form)  No data  "MOST" Form in Place?  No data     Patient's wife was present during my visit.  TOTAL TIME TAKING CARE OF THIS PATIENT: 45 minutes.  Altamese Dilling M.D on 01/12/2016   Between 7am to 6pm - Pager - [phone]  After 6pm go to [email or web address] - password [email or web address]  [facility] [place] [facility]  Office  [phone]  CC: Primary care physician; Brayton El, MD   Note: This dictation was prepared with Dragon dictation along with smaller phrase technology. Any transcriptional errors that result from this process are unintentional.

## 2015-12-26 NOTE — ED Notes (Signed)
MD VACHHANI at bedside

## 2015-12-26 NOTE — ED Notes (Signed)
Patient denies chest pain or SOB at this time. Patient placed on 1L O2 due to O2 sat dropping to 88 on RA

## 2015-12-26 NOTE — ED Notes (Signed)
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 Denied surgery due to not wanting to be on dialysis. [facility] and left [facility] before tests could be ran. Patient is also [facility] for abdominal and back pain.

## 2015-12-26 NOTE — ED Notes (Signed)
ED Provider at bedside. 

## 2015-12-26 NOTE — ED Triage Notes (Addendum)
Pt c/o lower abd pain with N/V watery stools since last Wednesday.Marland Kitchen.p-t has known AAA that requires surgery, [facility]

## 2015-12-27 LAB — BASIC METABOLIC PANEL
ANION GAP: 12 (ref 5–15)
BUN: 46 mg/dL — AB (ref 6–20)
CALCIUM: 9.1 mg/dL (ref 8.9–10.3)
CO2: 24 mmol/L (ref 22–32)
Chloride: 98 mmol/L — ABNORMAL LOW (ref 101–111)
Creatinine, Ser: 2.24 mg/dL — ABNORMAL HIGH (ref 0.61–1.24)
GFR calc Af Amer: 31 mL/min — ABNORMAL LOW (ref >60)
GFR, EST NON AFRICAN AMERICAN: 27 mL/min — AB (ref >60)
GLUCOSE: 98 mg/dL (ref 65–99)
POTASSIUM: 3.2 mmol/L — AB (ref 3.5–5.1)
SODIUM: 134 mmol/L — AB (ref 135–145)

## 2015-12-27 LAB — URINALYSIS, COMPLETE (UACMP) WITH MICROSCOPIC
BACTERIA UA: NONE SEEN
BILIRUBIN URINE: NEGATIVE
Glucose, UA: NEGATIVE mg/dL
KETONES UR: 5 mg/dL — AB
LEUKOCYTES UA: NEGATIVE
Nitrite: NEGATIVE
PROTEIN: 30 mg/dL — AB
Specific Gravity, Urine: 1.014 (ref 1.005–1.030)
pH: 5 (ref 5.0–8.0)

## 2015-12-27 LAB — CBC
HCT: 40.1 % (ref 40.0–52.0)
Hemoglobin: 13.6 g/dL (ref 13.0–18.0)
MCH: 31.1 pg (ref 26.0–34.0)
MCHC: 34 g/dL (ref 32.0–36.0)
MCV: 91.5 fL (ref 80.0–100.0)
Platelets: 185 10*3/uL (ref 150–440)
RBC: 4.38 MIL/uL — ABNORMAL LOW (ref 4.40–5.90)
RDW: 13.8 % (ref 11.5–14.5)
WBC: 13.3 10*3/uL — AB (ref 3.8–10.6)

## 2015-12-27 LAB — C DIFFICILE QUICK SCREEN W PCR REFLEX
C DIFFICILE (CDIFF) TOXIN: NEGATIVE
C DIFFICLE (CDIFF) ANTIGEN: NEGATIVE
C Diff interpretation: NOT DETECTED

## 2015-12-27 LAB — HEMOGLOBIN
HEMOGLOBIN: 13.4 g/dL (ref 13.0–18.0)
Hemoglobin: 14.1 g/dL (ref 13.0–18.0)

## 2015-12-27 LAB — MAGNESIUM: Magnesium: 1.8 mg/dL (ref 1.7–2.4)

## 2015-12-27 MED ORDER — POTASSIUM CHLORIDE CRYS ER 20 MEQ PO TBCR
40.0000 meq | EXTENDED_RELEASE_TABLET | ORAL | Status: AC
  Administered 2015-12-27 (×2): 40 meq via ORAL
  Filled 2015-12-27 (×2): qty 2

## 2015-12-27 MED ORDER — ENSURE ENLIVE PO LIQD
237.0000 mL | Freq: Three times a day (TID) | ORAL | Status: DC
  Administered 2015-12-27: 237 mL via ORAL

## 2015-12-27 MED ORDER — ORAL CARE MOUTH RINSE
15.0000 mL | Freq: Two times a day (BID) | OROMUCOSAL | Status: DC
  Administered 2015-12-28: 15 mL via OROMUCOSAL

## 2015-12-27 NOTE — Care Management Important Message (Signed)
Important Message  Patient Details  Name: Saverio DankerLarry W Seabrook MRN: [id number] Date of Birth: [birth year]   Medicare Important Message Given:  Yes    Gwenette GreetBrenda S Hosey Burmester, RN 12/27/2015, 11:00 AM

## 2015-12-27 NOTE — Care Management (Signed)
Admitted to this facility with the diagnosis of acute kidney failure. Lives with wife, Synetta Failnita [phone]). Last seen Dr. Rogue JuryShag a week ago. Rolling walker and cane available, if needed. No home health. [facility] for rehabilitation 2 years ago for a couple of weeks.  No home oxygen. No falls. Decreased appetite x 1 week. Loss 8 pounds. Pres[facility] [place]. Takes care of all basic activities of daily living himself, drives. Wife will transport. Gwenette GreetBrenda S Arling Cerone RN MSN CCM Care Management

## 2015-12-27 NOTE — Progress Notes (Signed)
Initial Nutrition Assessment  DOCUMENTATION CODES:   Severe malnutrition in context of acute illness/injury  INTERVENTION:  Provide Ensure Enlive po TID, each supplement provides 350 kcal and 20 grams of protein.  Encouraged adequate intake of calories and protein with meals and beverages.   NUTRITION DIAGNOSIS:   Inadequate oral intake related to poor appetite, other (see comment) (abdominal pain, diarrhea) as evidenced by 5 percent weight loss in one week, per patient/family report.  GOAL:   Patient will meet greater than or equal to 90% of their needs  MONITOR:   PO intake, Supplement acceptance, Labs, Weight trends, I & O's  REASON FOR ASSESSMENT:   Malnutrition Screening Tool    ASSESSMENT:   75 y.o. male with a known history of Abdominal aortic aneurysm, elevated PSA, hyperlipidemia, hypertension, chronic renal disorder, stroke, has 7 cm abdominal aortic aneurysm for last few weeks. In the past he has refused aneurysm surgery because of fear of ending up on hemodialysis. Patient found to have acute on chronic renal failure.   -Per chart AAA is stable, so plan for pain control and [facility].  Spoke with patient and son at bedside. Patient reports poor appetite for one week now in setting of abdominal pain and diarrhea. He is also experiencing difficulty chewing as he forgot his dentures, but son reports he will bring those in today. Denies N/V. Patient reports he still attempts 3 meals per day but he is having bad diarrhea after meals so not tolerating well. Patient amenable to drinking Ensure between meals.   UBW 165 lbs. Patient reports 8 lbs weight loss (5% body weight) over the past week, which is significant for time frame.   Medications reviewed and include: pantoprazole, NS @ 100 ml/hr.  Labs reviewed: Sodium 134, Potassium 3.2, Chloride 98, BUN 46, Creatinine 2.24.   Nutrition-Focused physical exam completed. Findings are mild-moderate fat  depletion, mild-moderate muscle depletion, and no edema.   Patient meets criteria for severe acute malnutrition in setting of 5% body weight in one week (by report), moderate fat depletion, moderate muscle depletion.  Diet Order:  Diet Heart Room service appropriate? Yes; Fluid consistency: Thin  Skin:  Wound (see comment) (Skin tears to right arm and leg)  Last BM:  12/27/2015  Height:   Ht Readings from Last 1 Encounters:  2015/10/06 5\' 8"  (1.727 m)    Weight:   Wt Readings from Last 1 Encounters:  2015/10/06 155 lb 1.6 oz (70.4 kg)    Ideal Body Weight:  70 kg  BMI:  Body mass index is 23.58 kg/m.  Estimated Nutritional Needs:   Kcal:  1700-1840 (MSJ x 1.2-1.3)  Protein:  70-85 (1-1.2 grams/kg)  Fluid:  >/= 1.7 L/day (25 ml/kg)  EDUCATION NEEDS:   Education needs addressed (Importance of adequate calories and protein with  meals and beverages.)  Helane RimaLeanne Kumiko Fishman, MS, RD, LDN Pager: [phone] After Hours Pager: [phone]

## 2015-12-27 NOTE — Care Management Important Message (Deleted)
Important Message  Patient Details  Name: John Knight MRN: [id number] Date of Birth: 08-01-1940   Medicare Important Message Given:  Yes    Gwenette GreetBrenda S Lakima Dona, RN 12/27/2015, 10:56 AM

## 2015-12-27 NOTE — Progress Notes (Addendum)
[facility] - [place] [facility]   PATIENT NAME: Vicki MalletLarry Navedo    MR#:  [id number]  DATE OF BIRTH:  May 26, 1940  SUBJECTIVE:  CHIEF COMPLAINT:   Chief Complaint  Patient presents with  . Abdominal Pain   Better abdominal pain. REVIEW OF SYSTEMS:  Review of Systems  Constitutional: Negative for chills, fever and malaise/fatigue.  HENT: Negative for congestion.   Eyes: Negative for blurred vision and double vision.  Respiratory: Negative for cough, shortness of breath and stridor.   Cardiovascular: Negative for chest pain and leg swelling.  Gastrointestinal: Positive for abdominal pain. Negative for blood in stool, diarrhea, melena, nausea and vomiting.  Genitourinary: Negative for dysuria and hematuria.  Musculoskeletal: Negative for back pain and joint pain.  Skin: Negative for itching and rash.  Neurological: Negative for dizziness, focal weakness, loss of consciousness and headaches.  Psychiatric/Behavioral: Negative for depression. The patient is not nervous/anxious.     DRUG ALLERGIES:   Allergies  Allergen Reactions  . Contrast Media [Iodinated Diagnostic Agents]    VITALS:  Blood pressure (!) 181/61, pulse 98, temperature 98.4 F (36.9 C), temperature source Oral, resp. rate 16, height 5\' 8"  (1.727 m), weight 155 lb 1.6 oz (70.4 kg), SpO2 96 %. PHYSICAL EXAMINATION:  Physical Exam  Constitutional: He is oriented to person, place, and time and well-developed, well-nourished, and in no distress.  HENT:  Head: Normocephalic.  Eyes: Conjunctivae and EOM are normal.  Neck: Normal range of motion. Neck supple. No JVD present. No tracheal deviation present.  Cardiovascular: Normal rate, regular rhythm and normal heart sounds.  Exam reveals no gallop.   No murmur heard. Pulmonary/Chest: Effort normal and breath sounds normal. No respiratory distress. He has no wheezes. He has no rales.  Abdominal: Soft. Bowel sounds are normal. He exhibits no  distension. There is tenderness.  Musculoskeletal: Normal range of motion. He exhibits no edema or tenderness.  Neurological: He is alert and oriented to person, place, and time. No cranial nerve deficit.  Skin: No rash noted. No erythema.  Psychiatric: Affect normal.   LABORATORY PANEL:   CBC  Recent Labs Lab 12/27/15 0122  12/27/15 1657  WBC 13.3*  --   --   HGB 13.6  < > 14.1  HCT 40.1  --   --   PLT 185  --   --   < > = values in this interval not displayed. ------------------------------------------------------------------------------------------------------------------ Chemistries   Recent Labs Lab 01/13/2016 1209 12/27/15 0122 12/27/15 0935  NA 131* 134*  --   K 3.4* 3.2*  --   CL 92* 98*  --   CO2 27 24  --   GLUCOSE 124* 98  --   BUN 42* 46*  --   CREATININE 2.58* 2.24*  --   CALCIUM 10.2 9.1  --   MG  --   --  1.8  AST 16  --   --   ALT 8*  --   --   ALKPHOS 100  --   --   BILITOT 1.2  --   --    RADIOLOGY:  No results found. ASSESSMENT AND PLAN:   * Ac on ch renal failure  Continue  IV NS, follow-up BMP, hold losartan.   Avoid nephro toxics.  * Hypokalemia. Give potassium supplement and follow-up BMP.  Hyponatremia. Continue normal saline IV and follow-up BMP.  * AAA   Stable.   [facility].  * Hypertension, accelerated  Continue hypertension medication, hydralazine IV when necessary. Hold losartan, start hydralazine.  * hyperlipidemia   Cont statin.  Tobacco abuse. Smoking cessation was counseled for 3 minutes.  All the records are reviewed and case discussed with Care Management/Social Worker. Management plans discussed with the patient, his wife and they are in agreement.  CODE STATUS: Full code  TOTAL TIME TAKING CARE OF THIS PATIENT: 37 minutes.   More than 50% of the time was spent in counseling/coordination of care: YES  POSSIBLE D/C IN 2 DAYS, DEPENDING ON CLINICAL CONDITION.   Shaune Pollackhen,  Eathon Valade M.D on 12/27/2015 at 6:04 PM  Between 7am to 6pm - Pager - [phone]  After 6pm go to [email or web address] - Social research officer, governmentpassword [email or web address]  [facility] [place] [facility]  Office  [phone]  CC: Primary care physician; Brayton ElSyed Shah, MD  Note: This dictation was prepared with Dragon dictation along with smaller phrase technology. Any transcriptional errors that result from this process are unintentional.

## 2015-12-28 ENCOUNTER — Inpatient Hospital Stay: Payer: Medicare Other

## 2015-12-28 LAB — BASIC METABOLIC PANEL
ANION GAP: 12 (ref 5–15)
BUN: 40 mg/dL — ABNORMAL HIGH (ref 6–20)
CALCIUM: 9.7 mg/dL (ref 8.9–10.3)
CO2: 25 mmol/L (ref 22–32)
Chloride: 102 mmol/L (ref 101–111)
Creatinine, Ser: 1.87 mg/dL — ABNORMAL HIGH (ref 0.61–1.24)
GFR calc Af Amer: 39 mL/min — ABNORMAL LOW (ref >60)
GFR calc non Af Amer: 33 mL/min — ABNORMAL LOW (ref >60)
GLUCOSE: 178 mg/dL — AB (ref 65–99)
Potassium: 3.5 mmol/L (ref 3.5–5.1)
Sodium: 139 mmol/L (ref 135–145)

## 2015-12-28 LAB — CBC
HEMATOCRIT: 44.2 % (ref 40.0–52.0)
HEMATOCRIT: 44.4 % (ref 40.0–52.0)
HEMOGLOBIN: 14.8 g/dL (ref 13.0–18.0)
HEMOGLOBIN: 14.5 g/dL (ref 13.0–18.0)
MCH: 31 pg (ref 26.0–34.0)
MCH: 30.3 pg (ref 26.0–34.0)
MCHC: 33.5 g/dL (ref 32.0–36.0)
MCHC: 32.7 g/dL (ref 32.0–36.0)
MCV: 92.8 fL (ref 80.0–100.0)
MCV: 92.8 fL (ref 80.0–100.0)
Platelets: 195 10*3/uL (ref 150–440)
Platelets: 193 10*3/uL (ref 150–440)
RBC: 4.76 MIL/uL (ref 4.40–5.90)
RBC: 4.79 MIL/uL (ref 4.40–5.90)
RDW: 14 % (ref 11.5–14.5)
RDW: 14.2 % (ref 11.5–14.5)
WBC: 22 10*3/uL — ABNORMAL HIGH (ref 3.8–10.6)
WBC: 27.1 10*3/uL — ABNORMAL HIGH (ref 3.8–10.6)

## 2015-12-28 LAB — GASTRIC OCCULT BLOOD (1-CARD TO LAB)
Occult Blood, Gastric: POSITIVE — AB
pH, Gastric: 5

## 2015-12-28 MED ORDER — PANTOPRAZOLE SODIUM 40 MG IV SOLR: 40.0000 mg | Freq: Two times a day (BID) | INTRAVENOUS | Status: DC

## 2015-12-28 MED FILL — Medication: Qty: 1 | Status: AC

## 2015-12-29 LAB — URINE CULTURE

## 2016-01-23 NOTE — Progress Notes (Signed)
Pt found unresponsive in bed, BP 55/27 with pulse 55 on monitor.  No pulse palpable - CODE BLUE called 1444. Patient with large amount of bloody emesis from nose and mouth.  CPR started immediately.  MD Imogene Burnhen to lead code at [facility] for twenty minutes with no return of pulse and pupils dilated.  [facility]

## 2016-01-23 NOTE — Progress Notes (Signed)
Chaplain received a page to visit room 118. This was a code blue.    01/08/2016 1500  Clinical Encounter Type  Visited With Other (Comment)  Visit Type Initial  Referral From Nurse  Consult/Referral To Chaplain  Spiritual Encounters  Spiritual Needs Other (Comment)

## 2016-01-23 NOTE — H&P (Signed)
[facility] called.  Spoke with April Shore.  Patient is eligible for tissue donation only.  Reference number [id number]

## 2016-01-23 NOTE — Progress Notes (Addendum)
CPR note.  The patient was found unresponsive and vomiting large amounts of dark blood. CODE BLUE was called at 14:44, CPR was started. The patient has a large amount of coffee-ground bloody vomitus. He was intubated and put NG tube with suction. The patient got epinephrine 5 times and bicarbonate. CPR was performed about 20 minutes. But the patient has no pulse, no response, his pupils are dilated, EKG showed  Asystole. He was pronounced death at 15:04. I discussed with ED phyisicans and RNs. Discussed with his wife.  CPR time spent 20 minutes.

## 2016-01-23 NOTE — Progress Notes (Signed)
[employer] [facility] -  at [facility]   PATIENT NAME: John MalletLarry Knight    MR#:  [id number]  DATE OF BIRTH:  1940/12/30  SUBJECTIVE:  CHIEF COMPLAINT:   Chief Complaint  Patient presents with  . Abdominal Pain   Epigastric abdominal pain, he has Coffee-ground liquid vomitus this morning. REVIEW OF SYSTEMS:  Review of Systems  Constitutional: Negative for chills, fever and malaise/fatigue.  HENT: Negative for congestion.   Eyes: Negative for blurred vision and double vision.  Respiratory: Negative for cough, shortness of breath and stridor.   Cardiovascular: Negative for chest pain and leg swelling.  Gastrointestinal: Positive for abdominal pain, nausea and vomiting. Negative for blood in stool, diarrhea and melena.       Coffee ground liquid vomitting  Genitourinary: Negative for dysuria and hematuria.  Musculoskeletal: Negative for back pain and joint pain.  Skin: Negative for itching and rash.  Neurological: Negative for dizziness, focal weakness, loss of consciousness and headaches.  Psychiatric/Behavioral: Negative for depression. The patient is not nervous/anxious.     DRUG ALLERGIES:   Allergies  Allergen Reactions  . Contrast Media [Iodinated Diagnostic Agents]    VITALS:  Blood pressure 106/73, pulse 94, temperature 97.5 F (36.4 C), temperature source Oral, resp. rate 20, height 5\' 8"  (1.727 m), weight 155 lb 1.6 oz (70.4 kg), SpO2 95 %. PHYSICAL EXAMINATION:  Physical Exam  Constitutional: He is oriented to person, place, and time and well-developed, well-nourished, and in no distress.  HENT:  Head: Normocephalic.  Eyes: Conjunctivae and EOM are normal.  Neck: Normal range of motion. Neck supple. No JVD present. No tracheal deviation present.  Cardiovascular: Normal rate, regular rhythm and normal heart sounds.  Exam reveals no gallop.   No murmur heard. Pulmonary/Chest: Effort normal and breath sounds normal. No respiratory distress. He has no  wheezes. He has no rales.  Abdominal: Soft. Bowel sounds are normal. He exhibits distension. There is tenderness. There is no rebound and no guarding.  Tenderness in epigastric area, middle part and right upper quadrant with  Musculoskeletal: Normal range of motion. He exhibits no edema or tenderness.  Neurological: He is alert and oriented to person, place, and time. No cranial nerve deficit.  Skin: No rash noted. No erythema.  Psychiatric: Affect normal.   LABORATORY PANEL:   CBC  Recent Labs Lab 05/29/2015 1217  WBC 27.1*  HGB 14.5  HCT 44.4  PLT 193   ------------------------------------------------------------------------------------------------------------------ Chemistries   Recent Labs Lab 01/10/2016 1209  12/27/15 0935 05/29/2015 0454  NA 131*  < >  --  139  K 3.4*  < >  --  3.5  CL 92*  < >  --  102  CO2 27  < >  --  25  GLUCOSE 124*  < >  --  178*  BUN 42*  < >  --  40*  CREATININE 2.58*  < >  --  1.87*  CALCIUM 10.2  < >  --  9.7  MG  --   --  1.8  --   AST 16  --   --   --   ALT 8*  --   --   --   ALKPHOS 100  --   --   --   BILITOT 1.2  --   --   --   < > = values in this interval not displayed. RADIOLOGY:  Dg Chest 2 View  Result Date: 01/02/2016 CLINICAL DATA:  Leukocytosis EXAM: CHEST  2 VIEW COMPARISON:  10/02/2012 FINDINGS: The heart size and mediastinal contours are within normal limits. Aortic calcifications are again noted. Both lungs are clear. The visualized skeletal structures are unremarkable. IMPRESSION: No active cardiopulmonary disease. Electronically Signed   By: Alcide CleverMark  Lukens M.D.   On: 01/07/2016 08:36   ASSESSMENT AND PLAN:   * Ac on ch renal failure  improving with  IV NS, follow-up BMP, hold losartan.   Avoid nephro toxics.  * Hypokalemia. Give potassium supplement and follow-up BMP.  Hyponatremia. Improved with normal saline IV.  * AAA    since the patient has coffee-ground vomiting with abdominal pain, I called for vascular  surgery consult. I discussed with Dr. Gilda CreaseSchnier, who will see the patient later today. CT abd and pelvis 4 days ago show stable greater than 7 cm abdominal aortic aneurysm. No evidence for acute perianeurysmal hemorrhage. He needs [facility]. [facility]  * Hypertension, accelerated   Continue hypertension medication, hydralazine IV when necessary. Hold losartan, started hydralazine. Better controlled.  * hyperlipidemia   Cont statin.  GI bleeding with coffee-ground vomitus. Hb 14.5. Keep nothing by mouth, IV fluid support, start Protonix IV, GI consult. Per Dr. Tobi BastosAnna, keep nothing by mouth and EGD tomorrow.  Leukocytosis. WBC was 22,000 this morning but increase to 27, 1000 around lunchtime.  Chest x-ray is clear, urinalysis is negative. Unclear etiology, possible related to GI bleeding and abdominal pain.  Tobacco abuse. Smoking cessation was counseled for 3 minutes.   At discussed with Dr. Gilda CreaseSchnier and Dr. Tobi BastosAnna.  All the records are reviewed and case discussed with Care Management/Social Worker. Management plans discussed with the patient, his wife and they are in agreement.  CODE STATUS: Full code  TOTAL TIME TAKING CARE OF THIS PATIENT: 46 minutes.   More than 50% of the time was spent in counseling/coordination of care: YES  POSSIBLE D/C IN ? DAYS, DEPENDING ON CLINICAL CONDITION.   Shaune Pollackhen, Lafern Brinkley M.D on 12/27/2015 at 1:12 PM  Between 7am to 6pm - Pager - [phone]  After 6pm go to [email or web address] - Social research officer, governmentpassword EPAS [facility]  [facility] [place] [facility]  Office  [phone]  CC: Primary care physician; Brayton ElSyed Shah, MD  Note: This dictation was prepared with Dragon dictation along with smaller phrase technology. Any transcriptional errors that result from this process are unintentional.

## 2016-01-23 NOTE — Progress Notes (Addendum)
   The patient was found unresponsive and vomiting large amounts of dark blood. CODE BLUE was called immediately and CPR was started. The patient had a large amount of coffee-ground bloody vomitus at bedside. He was intubated and put NG tube with suction. A large amount of coffee-ground liquid was suctioned. The patient got epinephrine 5 times and bicarbonate. CPR was performed about 20 minutes. But the patient has no pulse, no response, his pupils are dilated, EKG showed flat line. The patient was pronounced death at 15:04. I discussed with ED phyisicans and RNs. Discussed with his wife and she voiced understanding.  Final diagnosis: AAA rupture GIB  Critical time spent 43 minutes.

## 2016-01-23 NOTE — Progress Notes (Signed)
Spoken to Dr Imogene Burnhen 2 hours back about the patient when I was consulted on him with stable HB and some coffee ground emesis . Just received a call that he has passed away before I could see him .  Dr Wyline MoodKiran Janifer Gieselman  Gastroenterology/Hepatology Pager: [phone]

## 2016-01-23 NOTE — Discharge Summary (Addendum)
[employer] - [place] at [facility]   PATIENT NAME: John MalletLarry Knight    MR#:  [id number]  DATE OF BIRTH:  12/27/1940  DATE OF ADMISSION:  12/24/2015   ADMITTING PHYSICIAN: Altamese DillingVaibhavkumar Vachhani, MD  DATE OF DEATH: 01/04/2016 PRIMARY CARE PHYSICIAN: Brayton ElSyed Shah, MD   ADMISSION DIAGNOSIS:  Dehydration [E86.0] Nausea vomiting and diarrhea [R11.2, R19.7] DISCHARGE DIAGNOSIS:  Principal Problem:   Acute renal failure (ARF) (HCC) AAA rupture GIB SECONDARY DIAGNOSIS:   Past Medical History:  Diagnosis Date  . AAA (abdominal aortic aneurysm) (HCC)   . AAA (abdominal aortic aneurysm) (HCC)   . Elevated PSA   . Hyperlipidemia   . Hypertension   . Renal disorder   . Stroke [facility](HCC)    HOSPITAL COURSE:   John MalletLarry Knight  is a 76 y.o. male with a known history of Abdominal aortic aneurysm, elevated PSA, hyperlipidemia, hypertension, chronic renal disorder, stroke- has 7 cm abdominal aortic aneurysm for last few weeks. [facility] was [facility] room a few days ago because of abdominal pain and diarrhea but decided to sign out AGAINST MEDICAL ADVICE. 2 days ago after signing against [facility] he came to ER in our hospital, ER physician spoke to the vascular surgeon at [facility] and repeated a CT scan which showed stable aneurysm so he suggested to give pain medication and discharged home with advice to follow up with him in the office. Patient's pain continued and he continued to have diarrhea. He is also not able to eat much so finally came to Hospital again. [facility] already suggested outpatient follow-up because of his stable aneurysm in the past ER physician advised to admit to hospitalist team for dehydration and acute renal failure. The patient was admitted for acute on chronic renal failure, has been treated with IV fluid support. Losartan was discontinued due to renal failure. His renal function is better. He still had epigastric abdominal pain and a small  amount (about 30 ml) coffee-ground liquid vomitus this morning. Repeat hemoglobin is 14.5, but white count increased to 27,1000.  I discussed with Dr. Gilda CreaseSchnier, vascular surgeon and Dr.  Tobi BastosAnna GI physician. Per Dr. Tobi BastosAnna, keep nothing by mouth, IV protonix and EGD tomorrow. Dr. Gilda CreaseSchnier said he will review the patient chart and see the patient. The patient was found unresponsive in bed, BP 55/27 with pulse 55 on monitor.  No pulse palpable - CODE BLUE was called at 14:44. Patient had large amount of bloody emesis from nose and mouth.  CPR started immediately. The patient had a large amount of coffee-ground bloody vomitus at bedside. He was intubated and put NG tube with suction. A large amount of coffee-ground liquid was suctioned. The patient got epinephrine 5 times and bicarbonate. CPR was performed about 20 minutes. But the patient has no pulse, no response, his pupils are dilated, EKG showed flat line. The patient was pronounced death at 15:04. I discussed with ED phyisicans and RNs. Discussed with his wife and she voiced understanding.  Final diagnosis. AAA rupture GI bleeding Hypotension,  cardiac arrest acute respiratory failure ARF on CKD HTN, HLP DISCHARGE CONDITIONS:  Expired at 15:04  Shaune Pollackhen, John Knight M.D on 01/11/2016 at 4:26 PM  Between 7am to 6pm - Pager - [phone]  After 6pm go to [email or web address] - Social research officer, governmentpassword EPAS [facility]  [facility] [place] [facility]  Office  [phone]  CC: Primary care physician; Brayton ElSyed Shah, MD   Note: This dictation was prepared with Dragon dictation along with smaller  Company secretary. Any transcriptional errors that result from this process are unintentional.

## 2016-01-23 NOTE — ED Provider Notes (Signed)
.  b ----------------------------------------- 3:22 PM on 04/21/15 -----------------------------------------  Called to the room for a condition blue. Patient was pulseless. CPR progress. Hospitalist was at bedside.. They did ask me to intubate the patient. Patient had copious amounts, but appeared to be over a liter, of black fluid coming from his oropharynx. I did therefore intubate him using direct  BN section. I witnessed the 7.5 tube passed to the lips, he is a 25 at the lips after intubation. Had good color change, clear auscultation and I was able to visualize placement through the cords. There was black effluence in the tube itself after words which likely represents aspiration. During this time, CPR was maintained. Patient was under the direct care of the hospitalist who is performing acute care in terms of resuscitation. I was there for airway management. After airway was secured, the hospitalist inform me that they would take it from there and I stepped out. There was no further intervention required me at that time and my will was intubation. He did have the tube passed at one passed.   Procedure: Intubation, reason, cardiac arrest GI bleed, no meds used, 7.5 tube at 25 at the lips after direct laryngoscopy with an 4.0 Mac blade. I did visualize the cords. Placement verified by auscultation, as well as color change. I was also able to recheck and directly visualized the cords with the tube in them after intubation. Patient was in cardiac arrest at the time. There were no complications to the procedure itself.   Jeanmarie PlantJames A Gemma Ruan, MD 04/21/15 [phone]

## 2016-01-23 DEATH — deceased

## 2016-02-13 ENCOUNTER — Ambulatory Visit: Payer: Medicare Other | Admitting: Family Medicine

## 2017-05-25 IMAGING — CT CT ABD-PELV W/O CM
2 of 4 series · 17 of 46 positions shown, 19 images · non-contrast
Comparison: 10/05/2015

CLINICAL DATA: Periumbilical pain for 2 days

EXAM:
CT ABDOMEN AND PELVIS WITHOUT CONTRAST
TECHNIQUE: Multidetector CT imaging of the abdomen and pelvis was performed
following the standard protocol without IV contrast.

[Series 2: routine soft tissue · axial · 0.78mm/px · z∈[-928,-553]mm · 14 of 83 slices shown, 16 images]
[im 4/83  soft-tissue]
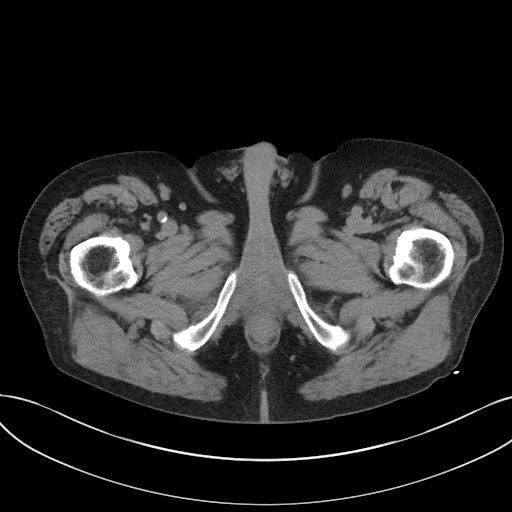
[im 4/83  bone]
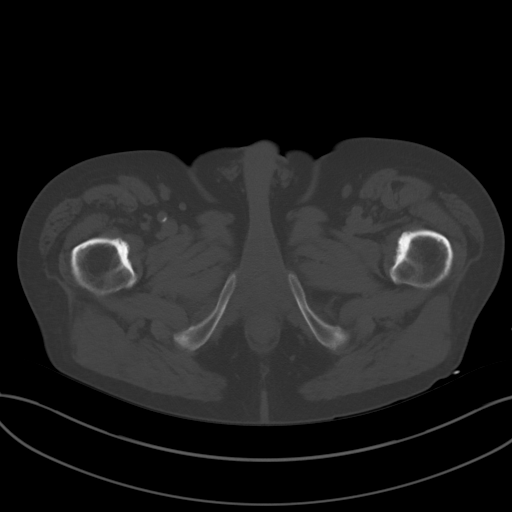
[im 11/83  soft-tissue]
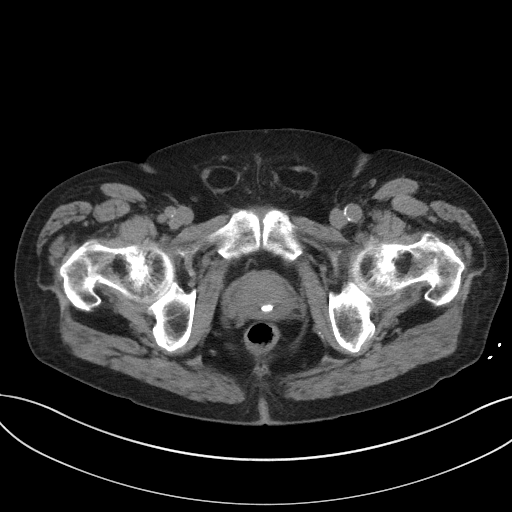
[im 18/83  soft-tissue]
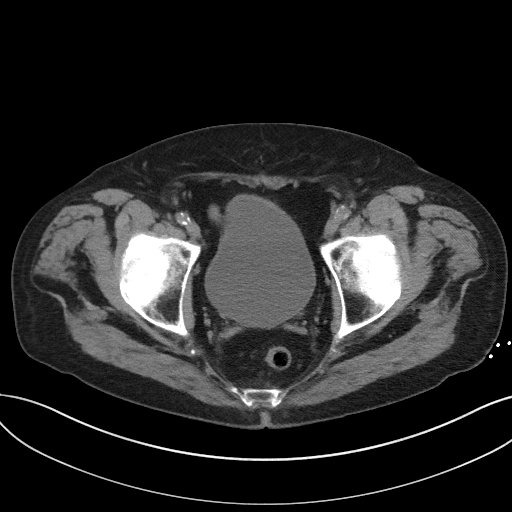
[im 21/83  soft-tissue]
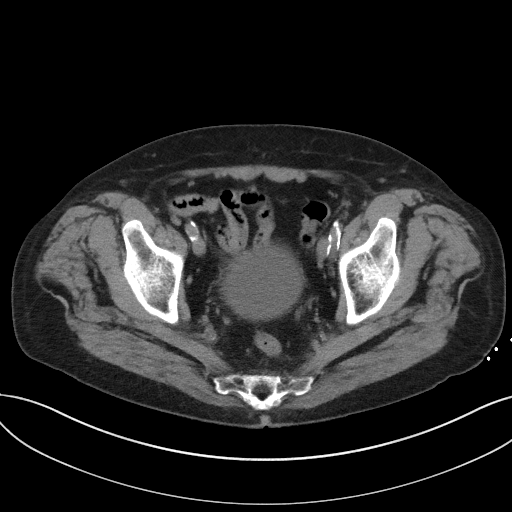
[im 28/83  soft-tissue]
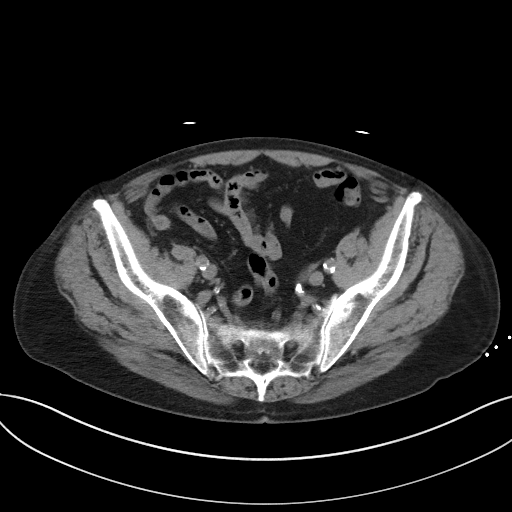
[im 35/83  soft-tissue]
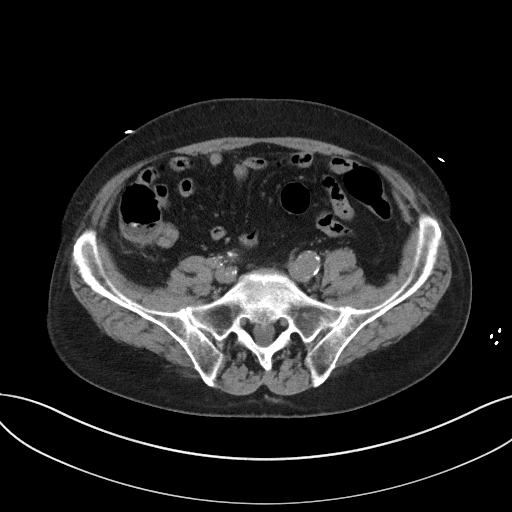
[im 38/83  soft-tissue]
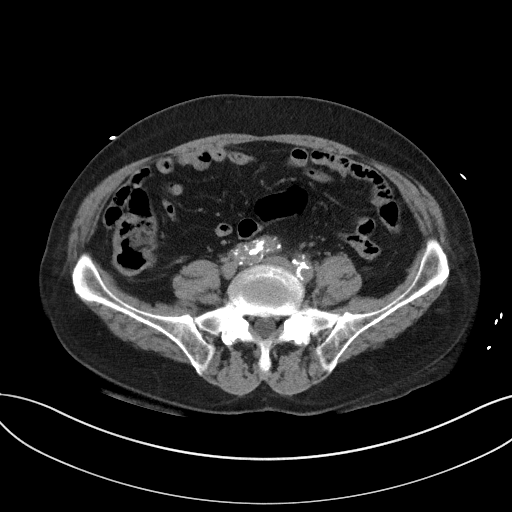
[im 45/83  soft-tissue]
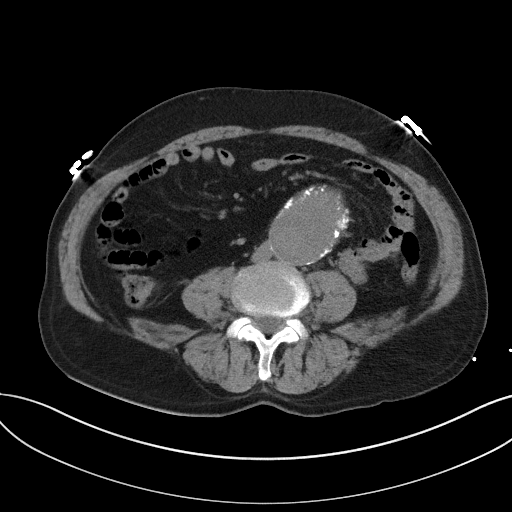
[im 48/83  soft-tissue]
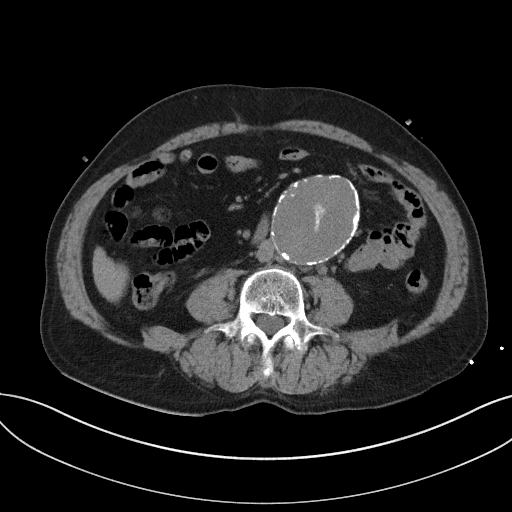
[im 48/83  bone]
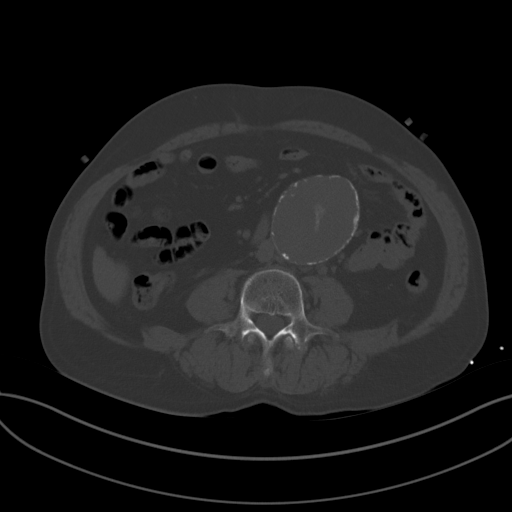
[im 55/83  soft-tissue]
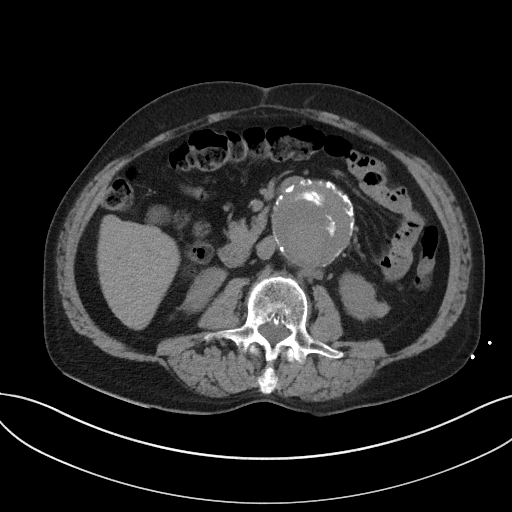
[im 62/83  soft-tissue]
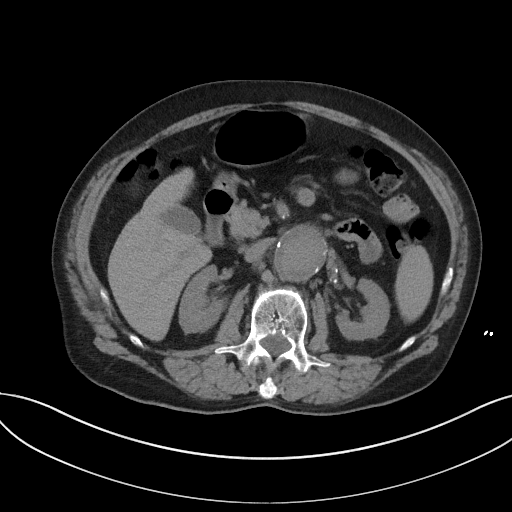
[im 65/83  soft-tissue]
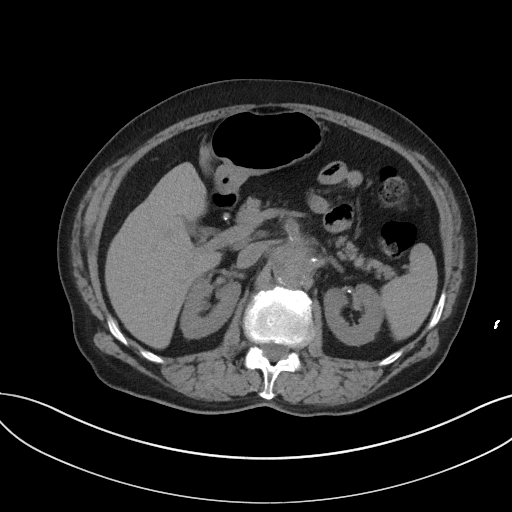
[im 72/83  soft-tissue]
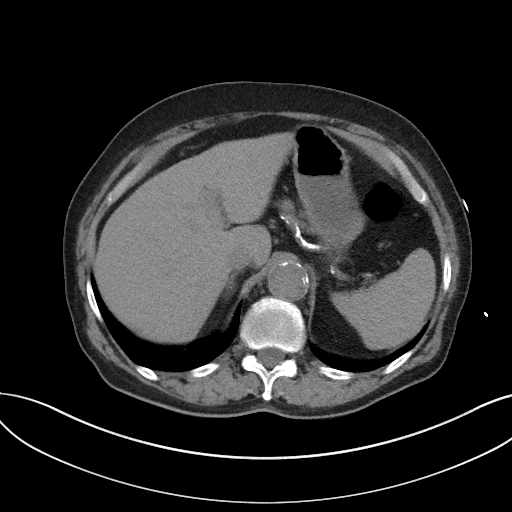
[im 79/83  soft-tissue]
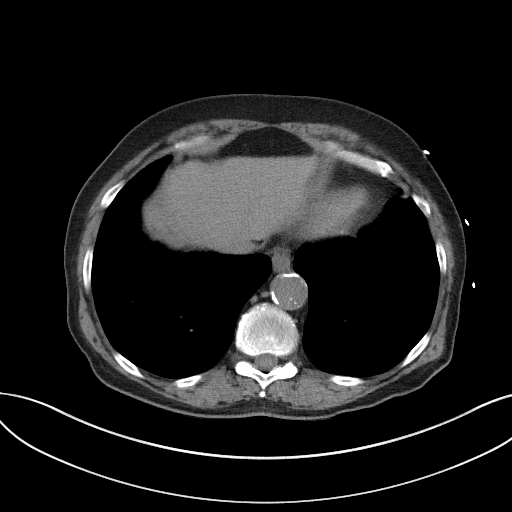

[Series 5: coronal st · coronal · 0.75mm/px · 3 of 94 slices shown]
[im 32/94  soft-tissue]
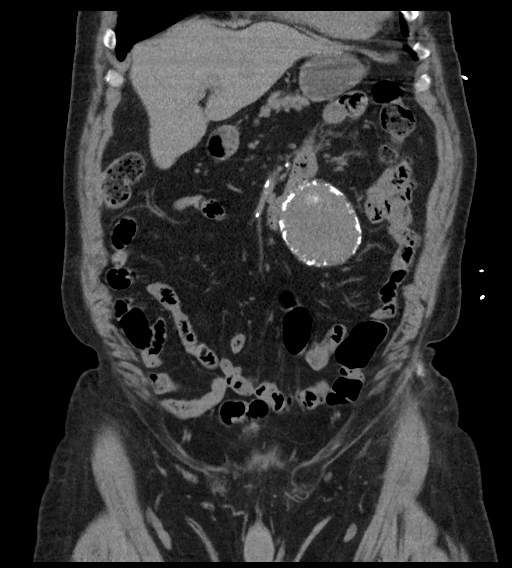
[im 42/94  soft-tissue]
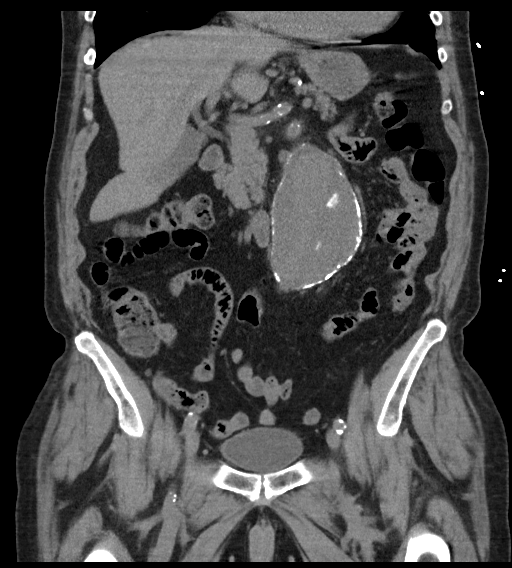
[im 52/94  soft-tissue]
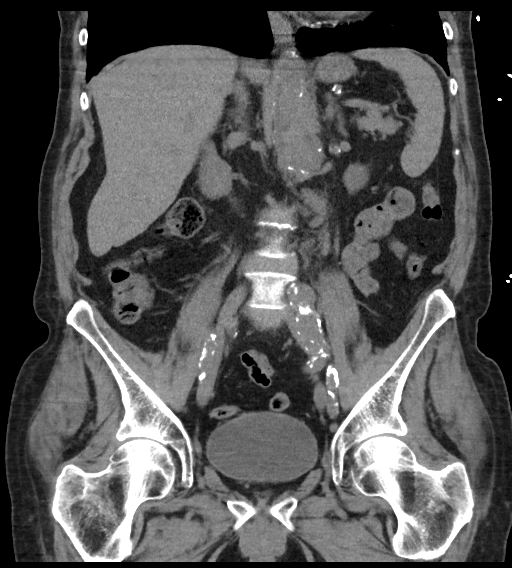

[17 of 46 positions shown; findings below may reference images not displayed]

FINDINGS: Lower chest: No acute abnormality.

Hepatobiliary: No focal liver abnormality is seen. No gallstones,
gallbladder wall thickening, or biliary dilatation.

Pancreas: Unremarkable. No pancreatic ductal dilatation or
surrounding inflammatory changes.

Spleen: Normal in size without focal abnormality.

Adrenals/Urinary Tract: Adrenal glands are unremarkable. Kidneys are
normal, without renal calculi, focal lesion, or hydronephrosis.
Bladder is unremarkable.

Stomach/Bowel: Stomach is within normal limits. Appendix appears
normal. No evidence of bowel wall thickening, distention, or
inflammatory changes.

Vascular/Lymphatic: Atherosclerotic changes are noted. There are
findings consistent with an infrarenal abdominal aortic aneurysm. It
measures approximately 7.1 x 7.4 cm in greatest transverse and AP
dimensions respectively. This is roughly similar from the prior
exam. No perianeurysmal hemorrhage is seen. No inflammatory changes
are noted.

Reproductive: Prostate is unremarkable.

Other: No abdominal wall hernia or abnormality. No abdominopelvic
ascites.

Musculoskeletal: No acute or significant osseous findings.
IMPRESSION: Stable abdominal aortic aneurysm.

No acute abnormality is noted.
